# Patient Record
Sex: Male | Born: 1960
Health system: Southern US, Community
[De-identification: ages and names within clinical notes are randomized; demographics above are authoritative.]

## PROBLEM LIST (undated history)

## (undated) DIAGNOSIS — N529 Male erectile dysfunction, unspecified: Secondary | ICD-10-CM

## (undated) DIAGNOSIS — E785 Hyperlipidemia, unspecified: Secondary | ICD-10-CM

## (undated) HISTORY — DX: Male erectile dysfunction, unspecified: N52.9

## (undated) HISTORY — PX: TENDON REPAIR: SHX5111

## (undated) HISTORY — DX: Hyperlipidemia, unspecified: E78.5

---

## 2010-04-17 ENCOUNTER — Encounter: Payer: Self-pay | Admitting: Family Medicine

## 2010-04-17 ENCOUNTER — Other Ambulatory Visit: Payer: Self-pay | Admitting: Family Medicine

## 2010-04-17 ENCOUNTER — Encounter (INDEPENDENT_AMBULATORY_CARE_PROVIDER_SITE_OTHER): Payer: Self-pay | Admitting: *Deleted

## 2010-04-17 ENCOUNTER — Ambulatory Visit (INDEPENDENT_AMBULATORY_CARE_PROVIDER_SITE_OTHER): Payer: Managed Care, Other (non HMO) | Admitting: Family Medicine

## 2010-04-17 DIAGNOSIS — Z0000 Encounter for general adult medical examination without abnormal findings: Secondary | ICD-10-CM

## 2010-04-17 DIAGNOSIS — Z23 Encounter for immunization: Secondary | ICD-10-CM

## 2010-04-17 DIAGNOSIS — Z1211 Encounter for screening for malignant neoplasm of colon: Secondary | ICD-10-CM

## 2010-04-17 DIAGNOSIS — E785 Hyperlipidemia, unspecified: Secondary | ICD-10-CM

## 2010-04-17 DIAGNOSIS — Z13220 Encounter for screening for lipoid disorders: Secondary | ICD-10-CM

## 2010-04-17 LAB — BASIC METABOLIC PANEL
BUN: 10 mg/dL (ref 6–23)
CO2: 28 mEq/L (ref 19–32)
Calcium: 9.3 mg/dL (ref 8.4–10.5)
Chloride: 105 mEq/L (ref 96–112)
Creatinine, Ser: 0.8 mg/dL (ref 0.4–1.5)
Glucose, Bld: 91 mg/dL (ref 70–99)

## 2010-04-17 LAB — LIPID PANEL: Triglycerides: 171 mg/dL — ABNORMAL HIGH (ref 0.0–149.0)

## 2010-04-17 LAB — HEPATIC FUNCTION PANEL
ALT: 22 U/L (ref 0–53)
Albumin: 4.2 g/dL (ref 3.5–5.2)
Bilirubin, Direct: 0.1 mg/dL (ref 0.0–0.3)
Total Protein: 7.2 g/dL (ref 6.0–8.3)

## 2010-04-17 LAB — CONVERTED CEMR LAB: HDL goal, serum: 40 mg/dL

## 2010-04-17 LAB — LDL CHOLESTEROL, DIRECT: Direct LDL: 148 mg/dL

## 2010-04-25 ENCOUNTER — Encounter (INDEPENDENT_AMBULATORY_CARE_PROVIDER_SITE_OTHER): Payer: Self-pay | Admitting: *Deleted

## 2010-04-25 NOTE — Assessment & Plan Note (Signed)
Summary: NEW PT TO EST JRT   Vital Signs:  Patient profile:   50 year old male Height:      72 inches Weight:      179.75 pounds BMI:     24.47 Temp:     97.8 degrees F oral Pulse rate:   76 / minute Pulse rhythm:   regular BP sitting:   134 / 90  (left arm) Cuff size:   regular  Vitals Entered By: Delilah Shan CMA Cheo Selvey Dull) (April 17, 2010 9:52 AM) CC: New Patient to Establish CPE   History of Present Illness: NP CPE: Needs to est care.  Occ constipation and occ loose stools.  Sleep disrupted with childcare and work schedule.  No blood in stool.  No FCNAV.   No known FH prostate CA or colon CA.    Needs screening for DM/HLD.    Mild decrease in libido f/o florid ED.  Not much scheduled exercise.    Lipid Management History:      Positive NCEP/ATP III risk factors include male age 24 years old or older.     Allergies (verified): No Known Drug Allergies  Past History:  Past Medical History: unremarkable  Past Surgical History: R 3rd finger tendon repair  Family History: Reviewed history and no changes required. F alive, recently reest contact with patient but minimal hx M alive, h/o breast cancer- treated 1 brother, minimal contact No known FH prostate CA or colon CA.    Social History: Reviewed history and no changes required. 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  Otherwise negative.    Physical Exam  General:  GEN: nad, alert and oriented HEENT: mucous membranes moist NECK: supple w/o LA CV: rrr.  no murmur PULM: ctab, no inc wob ABD: soft, +bs EXT: no edema SKIN: no acute rash  Rectal:  No external abnormalities noted. Normal sphincter tone. No rectal masses or  tenderness. Prostate:  Prostate gland firm and smooth, no enlargement, nodularity, tenderness, mass, asymmetry or induration.   Impression & Recommendations:  Problem # 1:  Preventive Health Care (ICD-V70.0) PSA options were discussed along with recent recs.  No indication for psa at this point, since patient is low risk and there is no FH of prosate CA.  He declined testing of PSA.  Flu shot encouraged.  Tdap done today.  refer for colon CA screen.  d/w patient ZO:XWRU and exrecise.  see labs.    Problem # 2:  SPECIAL SCREENING FOR MALIGNANT NEOPLASMS COLON (ICD-V76.51) refer.  Orders: Gastroenterology Referral (GI)  Problem # 3:  SCREENING FOR LIPOID DISORDERS (ICD-V77.91) see labs.   Orders: TLB-BMP (Basic Metabolic Panel-BMET) (80048-METABOL) TLB-Hepatic/Liver Function Pnl (80076-HEPATIC) TLB-Lipid Panel (80061-LIPID)  Other Orders: Tdap => 22yrs IM (04540) Admin 1st Vaccine (98119)  Lipid Assessment/Plan:      Based on NCEP/ATP III, the patient's risk factor category is "0-1 risk factors".  The patient's lipid goals are as follows: Total cholesterol goal is 200; LDL cholesterol goal is 160; HDL cholesterol goal is 40; Triglyceride goal is 150.     Patient Instructions: 1)  See Shirlee Limerick about your referral before your leave today.   2)  I would  work on getting more exercise.   3)  You can get your results through our phone system.  Follow the instructions on the blue card.    Orders Added: 1)  New Patient 40-64 years [99386] 2)  TLB-BMP (Basic Metabolic Panel-BMET) [80048-METABOL] 3)  TLB-Hepatic/Liver Function Pnl [80076-HEPATIC] 4)  TLB-Lipid Panel [80061-LIPID] 5)  Tdap => 42yrs IM [90715] 6)  Admin 1st Vaccine [90471] 7)  Gastroenterology Referral [GI]   Immunizations Administered:  Tetanus Vaccine:    Vaccine Type: Tdap    Site: left deltoid    Mfr: GlaxoSmithKline    Dose: 0.5 ml    Route: IM    Given by: Delilah Shan CMA (AAMA)    Exp. Date: 12/16/2011     Lot #: [id number]    VIS given: 01/14/08 version given April 17, 2010.   Immunizations Administered:  Tetanus Vaccine:    Vaccine Type: Tdap    Site: left deltoid    Mfr: GlaxoSmithKline    Dose: 0.5 ml    Route: IM    Given by: Delilah Shan CMA (AAMA)    Exp. Date: 12/16/2011    Lot #: [id number]    VIS given: 01/14/08 version given April 17, 2010.  Prior Medications (reviewed today): None Current Allergies (reviewed today): No known allergies

## 2010-04-25 NOTE — Letter (Signed)
Summary: Pre Visit Letter Revised  [facility]  [address] [place], [place] [address]   Phone: [phone]  Fax: [phone]        04/17/2010 MRN: [id number] Cody Ryan [address] [place], [place]  [address]  [place]             Procedure Date:  May 22, 2010   dir col-Dr Tomasita Morrow to the Gastroenterology Division at [facility].    You are scheduled to see a nurse for your pre-procedure visit on May 01, 2010 at 4:00pm on the 3rd floor at [facility], [address]  We ask that you try to arrive at our office 15 minutes prior to your appointment time to allow for check-in.  Please take a minute to review the attached form.  If you answer "Yes" to one or more of the questions on the first page, we ask that you call the person listed at your earliest opportunity.  If you answer "No" to all of the questions, please complete the rest of the form and bring it to your appointment.    Your nurse visit will consist of discussing your medical and surgical history, your immediate family medical history, and your medications.   If you are unable to list all of your medications on the form, please bring the medication bottles to your appointment and we will list them.  We will need to be aware of both prescribed and over the counter drugs.  We will need to know exact dosage information as well.    Please be prepared to read and sign documents such as consent forms, a financial agreement, and acknowledgement forms.  If necessary, and with your consent, a friend or relative is welcome to sit-in on the nurse visit with you.  Please bring your insurance card so that we may make a copy of it.  If your insurance requires a referral to see a specialist, please bring your referral form from your primary care physician.  No co-pay is required for this nurse visit.     If you cannot keep your appointment, please call [phone] to cancel or reschedule prior  to your appointment date.  This allows [place] the opportunity to schedule an appointment for another patient in need of care.    Thank you for choosing [facility] for your medical needs.  We appreciate the opportunity to care for you.  Please visit [place] at our website  to learn more about our practice.  Sincerely, The Gastroenterology Division

## 2010-05-01 ENCOUNTER — Encounter: Payer: Self-pay | Admitting: Gastroenterology

## 2010-05-09 NOTE — Miscellaneous (Signed)
Summary: [facility] PV  Clinical Lists Changes  Medications: Added new medication of MOVIPREP 100 GM  SOLR (PEG-KCL-NACL-NASULF-NA ASC-C) As per prep instructions. - Signed Rx of MOVIPREP 100 GM  SOLR (PEG-KCL-NACL-NASULF-NA ASC-C) As per prep instructions.;  #1 x 0;  Signed;  Entered by: Ezra Sites RN;  Authorized by: Mardella Layman MD [facility];  Method used: [facility]  [place], [place], [place]  [address], Ph: [phone] or [phone], Fax: [phone] Observations: Added new observation of NKA: T (05/01/2010 16:13)    Prescriptions: MOVIPREP 100 GM  SOLR (PEG-KCL-NACL-NASULF-NA ASC-C) As per prep instructions.  #1 x 0   Entered by:   Ezra Sites RN   Authorized by:   Mardella Layman MD [facility]   Signed by:   Ezra Sites RN on 05/01/2010   Method used:   [facility]        [facility]  [place] (retail)       [address]       [place], [place]  [address]       Ph: [phone] or [phone]       Fax: [phone]   RxID:   [id number]

## 2010-05-09 NOTE — Letter (Signed)
Summary: [facility] Instructions  [facility]  [address] [place], [place] [address]   Phone: [phone]  Fax: [phone]       Cody Ryan    1960/12/13    MRN: [id number]        Procedure Day Dorna Bloom:  Duanne Limerick 05/22/10     Arrival Time: 9:30am     Procedure Time: 10:30am     Location of Procedure:                    _ X_  [facility] (4th Floor)                       PREPARATION FOR COLONOSCOPY WITH MOVIPREP   Starting 5 days prior to your procedure  [facility] 03/21  do not eat nuts, seeds, popcorn, corn, beans, peas,  salads, or any raw vegetables.  Do not take any fiber supplements (e.g. Metamucil, Citrucel, and Benefiber).  THE DAY BEFORE YOUR PROCEDURE         DATE: SUNDAY 03/25  1.  Drink clear liquids the entire day-NO SOLID FOOD  2.  Do not drink anything colored red or purple.  Avoid juices with pulp.  No orange juice.  3.  Drink at least 64 oz. (8 glasses) of fluid/clear liquids during the day to prevent dehydration and help the prep work efficiently.  CLEAR LIQUIDS INCLUDE: Water Jello Ice Popsicles Tea (sugar ok, no milk/cream) Powdered fruit flavored drinks Coffee (sugar ok, no milk/cream) Gatorade Juice: apple, white grape, white cranberry  Lemonade Clear bullion, consomm, broth Carbonated beverages (any kind) Strained chicken noodle soup Hard Candy                             4.  In the morning, mix first dose of MoviPrep solution:    Empty 1 Pouch A and 1 Pouch B into the disposable container    Add lukewarm drinking water to the top line of the container. Mix to dissolve    Refrigerate (mixed solution should be used within 24 hrs)  5.  Begin drinking the prep at 5:00 p.m. The MoviPrep container is divided by 4 marks.   Every 15 minutes drink the solution down to the next mark (approximately 8 oz) until the full liter is complete.   6.  Follow completed prep with 16 oz of clear liquid of your choice (Nothing  red or purple).  Continue to drink clear liquids until bedtime.  7.  Before going to bed, mix second dose of MoviPrep solution:    Empty 1 Pouch A and 1 Pouch B into the disposable container    Add lukewarm drinking water to the top line of the container. Mix to dissolve    Refrigerate  THE DAY OF YOUR PROCEDURE      DATE: MONDAY 03/26  Beginning at  5:30a.m. (5 hours before procedure):         1. Every 15 minutes, drink the solution down to the next mark (approx 8 oz) until the full liter is complete.  2. Follow completed prep with 16 oz. of clear liquid of your choice.    3. You may drink clear liquids until 8:30am  (2 HOURS BEFORE PROCEDURE).   MEDICATION INSTRUCTIONS  Unless otherwise instructed, you should take regular prescription medications with a small sip of water   as early as possible the morning of your procedure.  OTHER INSTRUCTIONS  You will need a responsible adult at least 51 years of age to accompany you and drive you home.   This person must remain in the waiting room during your procedure.  Wear loose fitting clothing that is easily removed.  Leave jewelry and other valuables at home.  However, you may wish to bring a book to read or  an iPod/MP3 player to listen to music as you wait for your procedure to start.  Remove all body piercing jewelry and leave at home.  Total time from sign-in until discharge is approximately 2-3 hours.  You should go home directly after your procedure and rest.  You can resume normal activities the  day after your procedure.  The day of your procedure you should not:   Drive   Make legal decisions   Operate machinery   Drink alcohol   Return to work  You will receive specific instructions about eating, activities and medications before you leave.    The above instructions have been reviewed and explained to me by   Ezra Sites RN  May 01, 2010 4:31 PM    I fully understand and can  verbalize these instructions _____________________________ Date _________

## 2010-05-19 ENCOUNTER — Encounter: Payer: Self-pay | Admitting: Gastroenterology

## 2010-05-22 ENCOUNTER — Ambulatory Visit: Payer: Managed Care, Other (non HMO) | Admitting: Gastroenterology

## 2010-05-22 ENCOUNTER — Encounter: Payer: Self-pay | Admitting: Gastroenterology

## 2010-05-22 VITALS — BP 133/79 | HR 76 | Temp 97.8°F | Resp 16 | Ht 72.0 in | Wt 170.0 lb

## 2010-05-22 DIAGNOSIS — Z1211 Encounter for screening for malignant neoplasm of colon: Secondary | ICD-10-CM

## 2010-05-22 DIAGNOSIS — D129 Benign neoplasm of anus and anal canal: Secondary | ICD-10-CM

## 2010-05-22 DIAGNOSIS — D128 Benign neoplasm of rectum: Secondary | ICD-10-CM

## 2010-05-22 DIAGNOSIS — R6889 Other general symptoms and signs: Secondary | ICD-10-CM

## 2010-05-22 LAB — HM COLONOSCOPY

## 2010-05-22 NOTE — Progress Notes (Signed)
500 ml ns added at 1102

## 2010-05-22 NOTE — Patient Instructions (Signed)
Normal Exam  Recommendations: Colonoscopy screening in 10 Years  Pt discharge instructions given and explained to pt and caregiver.

## 2010-05-23 ENCOUNTER — Telehealth: Payer: Self-pay | Admitting: *Deleted

## 2010-05-23 NOTE — Telephone Encounter (Signed)

## 2010-05-26 ENCOUNTER — Encounter: Payer: Self-pay | Admitting: Gastroenterology

## 2010-05-30 NOTE — Procedures (Signed)
Summary: Colonoscopy  Patient: Cody Ryan Note: All result statuses are Final unless otherwise noted.  Tests: (1) Colonoscopy (COL)   COL Colonoscopy           DONE     [facility]     [address]     [place], [place]  [address]          COLONOSCOPY PROCEDURE REPORT          PATIENT:  [name], [name]  MR#:  [id number]     BIRTHDATE:  1961/01/24, 49 yrs. old  GENDER:  male     ENDOSCOPIST:  Vania Rea. Jarold Motto, MD, [facility]     REF. BY:     PROCEDURE DATE:  05/22/2010     PROCEDURE:  Average-risk screening colonoscopy     G0121     ASA CLASS:  Class I     INDICATIONS:  Routine Risk Screening     MEDICATIONS:   Fentanyl 75 mcg IV, Versed 7 mg IV          DESCRIPTION OF PROCEDURE:   After the risks benefits and     alternatives of the procedure were thoroughly explained, informed     consent was obtained.  Digital rectal exam was performed and     revealed no abnormalities.   The [id number] endoscope was     introduced through the anus and advanced to the cecum, which was     identified by both the appendix and ileocecal valve, without     limitations.  The quality of the prep was excellent, using     MoviPrep.  The instrument was then slowly withdrawn as the colon     was fully examined.     <<PROCEDUREIMAGES>>          FINDINGS:  No polyps or cancers were seen.  This was otherwise a     normal examination of the colon.   Retroflexed views in the rectum     revealed no abnormalities.    The scope was then withdrawn from     the patient and the procedure completed.          COMPLICATIONS:  None     ENDOSCOPIC IMPRESSION:     1) No polyps or cancers     2) Otherwise normal examination     RECOMMENDATIONS:     1) Continue current colorectal screening recommendations for     "routine risk" patients with a repeat colonoscopy in 10 years.     REPEAT EXAM:  No          ______________________________     Vania Rea. Jarold Motto, MD, [facility]          CC:       n.     eSIGNED:   Vania Rea. Jessicah Croll at 05/22/2010 11:26 AM          Epifania Gore, [id number]  Note: An exclamation mark (!) indicates a result that was not dispersed into the flowsheet. Document Creation Date: 05/22/2010 11:27 AM _______________________________________________________________________  (1) Order result status: Final Collection or observation date-time: 05/22/2010 11:21 Requested date-time:  Receipt date-time:  Reported date-time:  Referring Physician:   Ordering Physician: Sheryn Bison [phone]) Specimen Source:  Source: Launa Grill Order Number: [id number] Lab site:

## 2011-04-16 ENCOUNTER — Encounter: Payer: Self-pay | Admitting: Family Medicine

## 2011-04-17 ENCOUNTER — Ambulatory Visit: Payer: Managed Care, Other (non HMO) | Admitting: Family Medicine

## 2011-04-26 ENCOUNTER — Ambulatory Visit (INDEPENDENT_AMBULATORY_CARE_PROVIDER_SITE_OTHER): Payer: Managed Care, Other (non HMO) | Admitting: Family Medicine

## 2011-04-26 ENCOUNTER — Encounter: Payer: Self-pay | Admitting: Family Medicine

## 2011-04-26 VITALS — BP 136/86 | HR 92 | Temp 97.4°F | Wt 171.0 lb

## 2011-04-26 DIAGNOSIS — N529 Male erectile dysfunction, unspecified: Secondary | ICD-10-CM

## 2011-04-26 MED ORDER — SILDENAFIL CITRATE 100 MG PO TABS: 50.0000 mg | ORAL_TABLET | Freq: Every day | ORAL | Status: DC | PRN

## 2011-04-26 NOTE — Patient Instructions (Signed)
Use the viagra, come back for labs, and call with concerns.  Glad to see you.

## 2011-04-26 NOTE — Progress Notes (Signed)
In last year, ED has gotten worse.  His erection doesn't last and he has trouble with arousal.  Libido is okay per patient.  Still with some AM erections, but he gets up very early so his sleep cycle is altered.  Urinary stream is okay.  Home situation is good, happy marriage.    Meds, vitals, and allergies reviewed.   ROS: See HPI.  Otherwise, noncontributory.  nad ncat Testes bilaterally descended without nodularity, tenderness or masses. No scrotal masses or lesions. No penis lesions or urethral discharge.

## 2011-04-27 DIAGNOSIS — N529 Male erectile dysfunction, unspecified: Secondary | ICD-10-CM | POA: Insufficient documentation

## 2011-04-27 NOTE — Assessment & Plan Note (Signed)
Can try viagra in meantime, with usual cautions.  Return for testosterone level.  If low, then will refer to uro.  He agrees.

## 2011-05-03 ENCOUNTER — Other Ambulatory Visit (INDEPENDENT_AMBULATORY_CARE_PROVIDER_SITE_OTHER): Payer: Managed Care, Other (non HMO)

## 2011-05-03 DIAGNOSIS — N529 Male erectile dysfunction, unspecified: Secondary | ICD-10-CM

## 2011-05-03 NOTE — Progress Notes (Signed)
Addended by: Liane Comber C on: 05/03/2011 10:59 AM   Modules accepted: Orders

## 2011-05-07 ENCOUNTER — Telehealth: Payer: Self-pay | Admitting: *Deleted

## 2011-05-07 NOTE — Telephone Encounter (Signed)
Received fax from [facility] stating that PA is needed for Viagra.  Called [employer] at [phone] to request PA form, form should be in our office before the end of today.

## 2011-05-07 NOTE — Telephone Encounter (Signed)
PA form in your IN box. 

## 2011-05-07 NOTE — Telephone Encounter (Signed)
PA form faxed to [facility] at [phone].

## 2011-05-07 NOTE — Telephone Encounter (Signed)
Done. It's in my out box.

## 2011-05-09 NOTE — Telephone Encounter (Signed)
That is usually the first option.  I don't think they would approve the other meds if that wasn't also approved.  It'll likely be out of pocket with any of the meds.

## 2011-05-09 NOTE — Telephone Encounter (Signed)
Patient wants to know if we have samples or coupons of Viagra or Cialis?  He stated that at this point he is willing to try anything to see if it helps.  Please advise.

## 2011-05-09 NOTE — Telephone Encounter (Signed)
Patient advised as instructed via telephone. 

## 2011-05-09 NOTE — Telephone Encounter (Signed)
If we have viagra samples or coupons, then give to patient.  I would start with that med first since it isn't long acting.  If we don't have samples or coupons, then it's out of pocket.

## 2011-05-09 NOTE — Telephone Encounter (Signed)
Patient advised via telephone that Viagra was not approved by his insurance company.  Patient is willing to try another medication and wants to know Dr. Lianne Bushy thoughts and opinions about this.  Denial letter sent to be scanned.

## 2011-05-11 NOTE — Telephone Encounter (Signed)
Patient advised as instructed via telephone, he will accept the Viagra coupons.  Will leave coupon at front desk for patient to pick up.

## 2011-09-12 ENCOUNTER — Other Ambulatory Visit: Payer: Self-pay | Admitting: Family Medicine

## 2011-09-12 DIAGNOSIS — E7800 Pure hypercholesterolemia, unspecified: Secondary | ICD-10-CM

## 2011-09-12 DIAGNOSIS — Z125 Encounter for screening for malignant neoplasm of prostate: Secondary | ICD-10-CM

## 2011-09-18 ENCOUNTER — Other Ambulatory Visit (INDEPENDENT_AMBULATORY_CARE_PROVIDER_SITE_OTHER): Payer: BC Managed Care – PPO

## 2011-09-18 DIAGNOSIS — Z125 Encounter for screening for malignant neoplasm of prostate: Secondary | ICD-10-CM

## 2011-09-18 DIAGNOSIS — E7800 Pure hypercholesterolemia, unspecified: Secondary | ICD-10-CM

## 2011-09-18 LAB — COMPREHENSIVE METABOLIC PANEL
Albumin: 4.4 g/dL (ref 3.5–5.2)
Alkaline Phosphatase: 54 U/L (ref 39–117)
Glucose, Bld: 93 mg/dL (ref 70–99)
Potassium: 4.4 mEq/L (ref 3.5–5.1)
Sodium: 137 mEq/L (ref 135–145)
Total Protein: 7.3 g/dL (ref 6.0–8.3)

## 2011-09-18 LAB — LIPID PANEL: HDL: 42.6 mg/dL (ref >39.00)

## 2011-09-18 LAB — PSA: PSA: 0.83 ng/mL (ref 0.10–4.00)

## 2011-09-25 ENCOUNTER — Encounter: Payer: Self-pay | Admitting: Family Medicine

## 2011-09-25 ENCOUNTER — Ambulatory Visit (INDEPENDENT_AMBULATORY_CARE_PROVIDER_SITE_OTHER): Payer: BC Managed Care – PPO | Admitting: Family Medicine

## 2011-09-25 VITALS — BP 126/84 | HR 78 | Temp 97.7°F | Wt 171.0 lb

## 2011-09-25 DIAGNOSIS — Z0000 Encounter for general adult medical examination without abnormal findings: Secondary | ICD-10-CM

## 2011-09-25 DIAGNOSIS — Z209 Contact with and (suspected) exposure to unspecified communicable disease: Secondary | ICD-10-CM

## 2011-09-25 DIAGNOSIS — K6289 Other specified diseases of anus and rectum: Secondary | ICD-10-CM

## 2011-09-25 DIAGNOSIS — N529 Male erectile dysfunction, unspecified: Secondary | ICD-10-CM

## 2011-09-25 DIAGNOSIS — M25519 Pain in unspecified shoulder: Secondary | ICD-10-CM

## 2011-09-25 DIAGNOSIS — B079 Viral wart, unspecified: Secondary | ICD-10-CM

## 2011-09-25 NOTE — Patient Instructions (Addendum)
I would get a flu shot each fall.   Go to the lab on the way out.  We'll contact you with your lab report. Use the exercises we discussed and take ibuprofen with food as needed.  If not improved, we may need to get you to orthopedics.  Take care.

## 2011-09-25 NOTE — Progress Notes (Signed)
CPE- See plan.  Routine anticipatory guidance given to patient.  See health maintenance. Tetanus 2012 Flu shot encouraged PSA wnl.  Consider recheck next year.  No FH prostate cancer.  Colon cancer screening.  No FH colon cancer known.  Due again in 2022.  Labs d/w pt.  Living will.  Discussed, encouraged.    ED with occ viagra use.  No ADE, good effect.    R shoulder pain, sharp, over the last week.  Pain with certain movements.  Best if laying flat on back. Sharp pain.  Intermittent pain/sx.  Still with good overhead ROM.    L hip pain and L lateral thigh, episodic over last few months.  Last episode was 2 months ago.  No pain today.    Small bump noted on penis.  Doesn't look like an ingrown hair.  Noted about 3-4 days ago.  Not itchy or painful.    Rectal itching.  Going on for long duration.  No acute flares.    PMH and SH reviewed  Meds, vitals, and allergies reviewed.   ROS: See HPI.  Otherwise negative.    GEN: nad, alert and oriented HEENT: mucous membranes moist NECK: supple w/o LA CV: rrr. PULM: ctab, no inc wob ABD: soft, +bs EXT: no edema SKIN: no acute rash Testes bilaterally descended without nodularity, tenderness or masses. No scrotal masses or lesions. No penis lesions except for 1-2 mm warty lesion on R side of shaft.  No urethral discharge.  R shoulder with pain on supraspinatus testing. Pain on int rotation, + impingement.  + scap assist.  No arm drop.

## 2011-09-26 DIAGNOSIS — M25519 Pain in unspecified shoulder: Secondary | ICD-10-CM | POA: Insufficient documentation

## 2011-09-26 DIAGNOSIS — Z0000 Encounter for general adult medical examination without abnormal findings: Secondary | ICD-10-CM | POA: Insufficient documentation

## 2011-09-26 DIAGNOSIS — K6289 Other specified diseases of anus and rectum: Secondary | ICD-10-CM | POA: Insufficient documentation

## 2011-09-26 DIAGNOSIS — B079 Viral wart, unspecified: Secondary | ICD-10-CM | POA: Insufficient documentation

## 2011-09-26 NOTE — Assessment & Plan Note (Signed)
Routine anticipatory guidance given to patient.  See health maintenance. Tetanus 2012 Flu shot encouraged PSA wnl.  Consider recheck next year.  No FH prostate cancer.  Colon cancer screening.  No FH colon cancer known.  Due again in 2022.  Labs d/w pt.  Living will.  Discussed, encouraged.

## 2011-09-26 NOTE — Assessment & Plan Note (Signed)
Likely cuff sx.  D/w pt about anatomy and home exercise program.  If not improved, then refer to ortho.  He agrees.

## 2011-09-26 NOTE — Assessment & Plan Note (Signed)
Treated x3 with liq N2 after verbal informed consent and tolerated well. See notes on labs.

## 2011-09-26 NOTE — Assessment & Plan Note (Signed)
Normal ext rectal exam, can use otc treatment as needed.

## 2011-09-26 NOTE — Assessment & Plan Note (Signed)
Controlled, continue current meds.   

## 2014-05-14 ENCOUNTER — Ambulatory Visit (INDEPENDENT_AMBULATORY_CARE_PROVIDER_SITE_OTHER): Payer: Managed Care, Other (non HMO) | Admitting: Primary Care

## 2014-05-14 ENCOUNTER — Encounter: Payer: Self-pay | Admitting: Primary Care

## 2014-05-14 VITALS — BP 136/72 | HR 98 | Temp 98.7°F | Ht 72.0 in | Wt 154.8 lb

## 2014-05-14 DIAGNOSIS — H6691 Otitis media, unspecified, right ear: Secondary | ICD-10-CM | POA: Diagnosis not present

## 2014-05-14 DIAGNOSIS — H6690 Otitis media, unspecified, unspecified ear: Secondary | ICD-10-CM | POA: Insufficient documentation

## 2014-05-14 MED ORDER — AMOXICILLIN 875 MG PO TABS: 875.0000 mg | ORAL_TABLET | Freq: Two times a day (BID) | ORAL | Status: DC

## 2014-05-14 NOTE — Progress Notes (Signed)
Subjective:    Patient ID: Cody Ryan, male    DOB: June 14, 1960, 54 y.o.   MRN: [id number]  HPI  Mr. Manuele is a 54 year old male who presents today with a chief complaint of ear pain. His pain is located to the right ear, had a sudden onset this morning with ringing and pressure. Three weeks ago he was acutely ill with chills, body aches, and sore throat. He began to feel better with the exception of a sore throat that he believes was caused by postnasal drip from his seasonal allergies. Last night he was unable to sleep due to pressure and pain in right ear. He's taken nothing OTC for his pain, and cannot pinpoint anything that makes pain better.   Review of Systems  Constitutional: Positive for chills and fatigue. Negative for fever.  HENT: Positive for ear pain, postnasal drip, sinus pressure, sore throat and trouble swallowing. Negative for rhinorrhea.   Eyes: Negative for itching.  Respiratory: Negative for cough and shortness of breath.   Cardiovascular: Negative for chest pain.  Gastrointestinal: Negative for nausea and vomiting.  Musculoskeletal: Positive for myalgias.  Skin: Negative for rash.  Neurological: Positive for headaches. Negative for dizziness.  Hematological: Negative for adenopathy.       Past Medical History  Diagnosis Date  . Erectile dysfunction     History   Social History  . Marital Status: Married    Spouse Name: N/A  . Number of Children: N/A  . Years of Education: N/A   Occupational History  . Not on file.   Social History Main Topics  . Smoking status: Passive Smoke Exposure - Never Smoker    Types: Cigarettes, Cigars  . Smokeless tobacco: Not on file  . Alcohol Use: 1.2 oz/week    2 Standard drinks or equivalent per week     Comment: 2 beers a week or less  . Drug Use: No  . Sexual Activity: Not on file   Other Topics Concern  . 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 Tendon repair      Right hand, 3rd finger    Family History  Problem Relation Age of Onset  . Cancer Mother     Breast, treated  . Colon cancer Neg Hx   . Prostate cancer Neg Hx     No Known Allergies  Current Outpatient Prescriptions on File Prior to Visit  Medication Sig Dispense Refill  . Multiple Vitamin (MULTIVITAMIN) tablet Take 1 tablet by mouth daily.     No current facility-administered medications on file prior to visit.    BP 136/72 mmHg  Pulse 98  Temp(Src) 98.7 F (37.1 C) (Oral)  Ht 6' (1.829 m)  Wt 154 lb 12.8 oz (70.217 kg)  BMI 20.99 kg/m2  SpO2 96%    Objective:   Physical Exam  Constitutional: He is oriented to person, place, and time. He appears well-developed.  HENT:  Right Ear: Tympanic membrane is injected, erythematous and bulging.  Left Ear: Tympanic membrane normal.  Nose: Nose normal.  Mouth/Throat: Posterior oropharyngeal edema and posterior oropharyngeal erythema present. No oropharyngeal exudate.  Eyes:  Conjunctivae are normal. Pupils are equal, round, and reactive to light.  Neck: Neck supple.  Cardiovascular: Normal rate and regular rhythm.   Pulmonary/Chest: Effort normal and breath sounds normal. He has no wheezes. He has no rales.  Lymphadenopathy:    He has no cervical adenopathy.  Neurological: He is alert and oriented to person, place, and time.  Skin: Skin is warm and dry.  Psychiatric: He has a normal mood and affect.          Assessment & Plan:

## 2014-05-14 NOTE — Patient Instructions (Signed)
Start Amoxicillin twice daily for 10 days. Use Ibuprofen for pain and inflammation. Please call me if no improvement in 3 days. I hope you feel better soon!  Otitis Media Otitis media is redness, soreness, and inflammation of the middle ear. Otitis media may be caused by allergies or, most commonly, by infection. Often it occurs as a complication of the common cold. SIGNS AND SYMPTOMS Symptoms of otitis media may include:  Earache.  Fever.  Ringing in your ear.  Headache.  Leakage of fluid from the ear. DIAGNOSIS To diagnose otitis media, your health care provider will examine your ear with an otoscope. This is an instrument that allows your health care provider to see into your ear in order to examine your eardrum. Your health care provider also will ask you questions about your symptoms. TREATMENT  Typically, otitis media resolves on its own within 3-5 days. Your health care provider may prescribe medicine to ease your symptoms of pain. If otitis media does not resolve within 5 days or is recurrent, your health care provider may prescribe antibiotic medicines if he or she suspects that a bacterial infection is the cause. HOME CARE INSTRUCTIONS   If you were prescribed an antibiotic medicine, finish it all even if you start to feel better.  Take medicines only as directed by your health care provider.  Keep all follow-up visits as directed by your health care provider. SEEK MEDICAL CARE IF:  You have otitis media only in one ear, or bleeding from your nose, or both.  You notice a lump on your neck.  You are not getting better in 3-5 days.  You feel worse instead of better. SEEK IMMEDIATE MEDICAL CARE IF:   You have pain that is not controlled with medicine.  You have swelling, redness, or pain around your ear or stiffness in your neck.  You notice that part of your face is paralyzed.  You notice that the bone behind your ear (mastoid) is tender when you touch it. MAKE  SURE YOU:   Understand these instructions.  Will watch your condition.  Will get help right away if you are not doing well or get worse. Document Released: 11/18/2003 Document Revised: 06/29/2013 Document Reviewed: 09/09/2012 [facility] Patient Information 2015 [place], [place]. This information is not intended to replace advice given to you by your health care provider. Make sure you discuss any questions you have with your health care provider.

## 2014-05-14 NOTE — Progress Notes (Signed)
Pre visit review using our clinic review tool, if applicable. No additional management support is needed unless otherwise documented below in the visit note. 

## 2014-05-14 NOTE — Assessment & Plan Note (Signed)
Acute, right. RX for Amoxicillin BID for 10 days. Ibuprofen for pain and inflammation. Follow up if no better in 3-4 days.

## 2014-08-08 ENCOUNTER — Encounter: Payer: Self-pay | Admitting: *Deleted

## 2014-08-08 ENCOUNTER — Emergency Department
Admission: EM | Admit: 2014-08-08 | Discharge: 2014-08-08 | Disposition: A | Discharge status: Home / Self Care | Payer: Managed Care, Other (non HMO) | Attending: Emergency Medicine | Admitting: Emergency Medicine

## 2014-08-08 DIAGNOSIS — Z79899 Other long term (current) drug therapy: Secondary | ICD-10-CM | POA: Diagnosis not present

## 2014-08-08 DIAGNOSIS — Y9389 Activity, other specified: Secondary | ICD-10-CM | POA: Insufficient documentation

## 2014-08-08 DIAGNOSIS — Y9289 Other specified places as the place of occurrence of the external cause: Secondary | ICD-10-CM | POA: Diagnosis not present

## 2014-08-08 DIAGNOSIS — Y998 Other external cause status: Secondary | ICD-10-CM | POA: Diagnosis not present

## 2014-08-08 DIAGNOSIS — S81812A Laceration without foreign body, left lower leg, initial encounter: Secondary | ICD-10-CM

## 2014-08-08 DIAGNOSIS — Z792 Long term (current) use of antibiotics: Secondary | ICD-10-CM | POA: Insufficient documentation

## 2014-08-08 MED ORDER — BACITRACIN-NEOMYCIN-POLYMYXIN OINTMENT TUBE
TOPICAL_OINTMENT | Freq: Once | CUTANEOUS | Status: AC
  Administered 2014-08-08: 1 via TOPICAL

## 2014-08-08 MED ORDER — TRAMADOL HCL 50 MG PO TABS
ORAL_TABLET | ORAL | Status: AC
  Administered 2014-08-08: 50 mg via ORAL
  Filled 2014-08-08: qty 1

## 2014-08-08 MED ORDER — OXYCODONE-ACETAMINOPHEN 7.5-325 MG PO TABS: 1.0000 | ORAL_TABLET | Freq: Four times a day (QID) | ORAL | Status: DC | PRN

## 2014-08-08 MED ORDER — BACITRACIN-NEOMYCIN-POLYMYXIN 400-5-5000 EX OINT
TOPICAL_OINTMENT | CUTANEOUS | Status: AC
  Administered 2014-08-08: 1 via TOPICAL
  Filled 2014-08-08: qty 1

## 2014-08-08 MED ORDER — LIDOCAINE-EPINEPHRINE (PF) 1 %-1:200000 IJ SOLN
INTRAMUSCULAR | Status: AC
  Filled 2014-08-08: qty 30

## 2014-08-08 MED ORDER — TRAMADOL HCL 50 MG PO TABS
50.0000 mg | ORAL_TABLET | Freq: Once | ORAL | Status: AC
  Administered 2014-08-08: 50 mg via ORAL

## 2014-08-08 NOTE — ED Notes (Signed)
Pt accidentally ran into a boat propeller, laceration sustained to lateral posterior L calf. Pt ambulatory in triage w/o complaints.

## 2014-08-08 NOTE — ED Provider Notes (Signed)
[facility] Emergency Department Provider Note  ____________________________________________  Time seen: Approximately 9:49 PM  I have reviewed the triage vital signs and the nursing notes.   HISTORY  Chief Complaint Extremity Laceration    HPI Cody Ryan is a 54 y.o. male laceration to the left lower leg secondary to cut by a boat propeller. Incident happened approximately 2 hours ago. Patient state hemorrhage and control with direct pressure. Denies any loss sensation or function. Patient tetanus shot is up-to-date. Patient is currently rating his pain as a 1/10.   Past Medical History  Diagnosis Date  . Erectile dysfunction     Patient Active Problem List   Diagnosis Date Noted  . Otitis media 05/14/2014  . Routine general medical examination at a health care facility 09/26/2011  . Rectal irritation 09/26/2011  . Shoulder pain 09/26/2011  . Wart 09/26/2011  . ED (erectile dysfunction) 04/27/2011    Past Surgical History  Procedure Laterality Date  . Tendon repair      Right hand, 3rd finger    Current Outpatient Rx  Name  Route  Sig  Dispense  Refill  . Multiple Vitamin (MULTIVITAMIN) tablet   Oral   Take 1 tablet by mouth daily.         Marland Kitchen amoxicillin (AMOXIL) 875 MG tablet   Oral   Take 1 tablet (875 mg total) by mouth 2 (two) times daily.   20 tablet   0   . oxyCODONE-acetaminophen (PERCOCET) 7.5-325 MG per tablet   Oral   Take 1 tablet by mouth every 6 (six) hours as needed for severe pain.   12 tablet   0     Allergies Review of patient's allergies indicates no known allergies.  Family History  Problem Relation Age of Onset  . Cancer Mother     Breast, treated  . Colon cancer Neg Hx   . Prostate cancer Neg Hx     Social History History  Substance Use Topics  . Smoking status: Passive Smoke Exposure - Never Smoker    Types: Cigarettes, Cigars  . Smokeless tobacco: Not on file  . Alcohol Use: 1.2 oz/week     2 Standard drinks or equivalent per week     Comment: 2 beers a week or less    Review of Systems Constitutional: No fever/chills Eyes: No visual changes. ENT: No sore throat. Cardiovascular: Denies chest pain. Respiratory: Denies shortness of breath. Gastrointestinal: No abdominal pain.  No nausea, no vomiting.  No diarrhea.  No constipation. Genitourinary: Negative for dysuria. Musculoskeletal: Negative for back pain. Skin: Negative for rash. 4 cm laceration lower left leg. Neurological: Negative for headaches, focal weakness or numbness.  10-point ROS otherwise negative.  ____________________________________________   PHYSICAL EXAM:  VITAL SIGNS: ED Triage Vitals  Enc Vitals Group     BP 08/08/14 2039 114/92 mmHg     Pulse Rate 08/08/14 2039 100     Resp 08/08/14 2039 18     Temp 08/08/14 2039 98.4 F (36.9 C)     Temp Source 08/08/14 2039 Oral     SpO2 08/08/14 2039 100 %     Weight 08/08/14 2039 155 lb (70.308 kg)     Height 08/08/14 2039 6' (1.829 m)     Head Cir --      Peak Flow --      Pain Score 08/08/14 2042 1     Pain Loc --      Pain Edu? --  Excl. in [place]? --    Constitutional: Alert and oriented. Well appearing and in no acute distress. Eyes: Conjunctivae are normal. PERRL. EOMI. Head: Atraumatic. Nose: No congestion/rhinnorhea. Mouth/Throat: Mucous membranes are moist.  Oropharynx non-erythematous. Neck: No stridor.  No deformity for nuchal range of motion nontender palpation. Hematological/Lymphatic/Immunilogical: No cervical lymphadenopathy. Cardiovascular: Normal rate, regular rhythm. Grossly normal heart sounds.  Good peripheral circulation. Respiratory: Normal respiratory effort.  No retractions. Lungs CTAB. Gastrointestinal: Soft and nontender. No distention. No abdominal bruits. No CVA tenderness. Musculoskeletal: No lower extremity tenderness nor edema.  No joint effusions. Neurologic:  Normal speech and language. No gross focal  neurologic deficits are appreciated. Speech is normal. No gait instability. Skin:  Skin is warm, dry and intact. No rash noted. Psychiatric: Mood and affect are normal. Speech and behavior are normal.  ____________________________________________   LABS (all labs ordered are listed, but only abnormal results are displayed)  Labs Reviewed - No data to display ____________________________________________  EKG   ____________________________________________  RADIOLOGY   ____________________________________________   PROCEDURES  Procedure(s) performed: See procedure note  Critical Care performed: No  ____________________________________________   INITIAL IMPRESSION / ASSESSMENT AND PLAN / ED COURSE  Pertinent labs & imaging results that were available during my care of the patient were reviewed by me and considered in my medical decision making (see chart for details).  LACERATION REPAIR Performed by: Sable Feil Authorized by: Sable Feil Consent: Verbal consent obtained. Risks and benefits: risks, benefits and alternatives were discussed Consent given by: patient Patient identity confirmed: provided demographic data Prepped and Draped in normal sterile fashion Wound explored  Laceration Location: Left lower leg  Laceration Length: 4 cm  No Foreign Bodies seen or palpated  Anesthesia: local infiltration  Local anesthetic: Lidocaine 1% with epinephrine   Anesthetic total: 7 mL Irrigation method: syringe Amount of cleaning: standard  Skin closure: 3-0 nylon Number of sutures: 10 interrupted  Patient tolerance: Patient tolerated the procedure well with no immediate complications.  ____________________________________________   FINAL CLINICAL IMPRESSION(S) / ED DIAGNOSES  Final diagnoses:  Leg laceration, left, initial encounter      Sable Feil, PA-C 08/08/14 2156  Delman Kitten, MD 08/09/14 [phone]

## 2014-08-08 NOTE — ED Notes (Signed)
Pt alert and in NAD at time of d/c 

## 2014-08-18 ENCOUNTER — Ambulatory Visit (INDEPENDENT_AMBULATORY_CARE_PROVIDER_SITE_OTHER): Payer: Managed Care, Other (non HMO) | Admitting: Family Medicine

## 2014-08-18 ENCOUNTER — Encounter: Payer: Self-pay | Admitting: Family Medicine

## 2014-08-18 VITALS — BP 100/60 | HR 84 | Temp 97.7°F | Wt 159.5 lb

## 2014-08-18 DIAGNOSIS — Z4802 Encounter for removal of sutures: Secondary | ICD-10-CM

## 2014-08-18 NOTE — Patient Instructions (Signed)
Take care.  Glad to see you.  The steri strips will gradually wear off.  Trim them as needed.

## 2014-08-18 NOTE — Progress Notes (Signed)
Pre visit review using our clinic review tool, if applicable. No additional management support is needed unless otherwise documented below in the visit note.  F/u for suture removal.  10 days out.  No complaints.  Td up to date.  No FCNAV.  No drainage.  No redness.  Normal ROM at the knee.   Meds, vitals, and allergies reviewed.   ROS: See HPI.  Otherwise, noncontributory.  L leg with normal inspection except for healing lac w/o erythema on the L knee  All 10 sutures removed easily.  No complication.  Still with good tissue apposition.  steristrips applied.

## 2014-08-19 DIAGNOSIS — Z4802 Encounter for removal of sutures: Secondary | ICD-10-CM | POA: Insufficient documentation

## 2014-08-19 NOTE — Assessment & Plan Note (Signed)
All 10 sutures removed easily.  No complication.  Still with good tissue apposition.  steristrips applied.   F/u prn.  He agrees.

## 2014-10-01 ENCOUNTER — Encounter: Payer: Self-pay | Admitting: Family Medicine

## 2014-10-01 ENCOUNTER — Ambulatory Visit (INDEPENDENT_AMBULATORY_CARE_PROVIDER_SITE_OTHER)
Admission: RE | Admit: 2014-10-01 | Discharge: 2014-10-01 | Disposition: A | Discharge status: Home / Self Care | Payer: Managed Care, Other (non HMO) | Source: Ambulatory Visit | Attending: Family Medicine | Admitting: Family Medicine

## 2014-10-01 ENCOUNTER — Ambulatory Visit (INDEPENDENT_AMBULATORY_CARE_PROVIDER_SITE_OTHER): Payer: Managed Care, Other (non HMO) | Admitting: Family Medicine

## 2014-10-01 VITALS — BP 120/70 | HR 71 | Temp 97.6°F | Wt 156.2 lb

## 2014-10-01 DIAGNOSIS — M25562 Pain in left knee: Secondary | ICD-10-CM

## 2014-10-01 DIAGNOSIS — M25569 Pain in unspecified knee: Secondary | ICD-10-CM | POA: Insufficient documentation

## 2014-10-01 NOTE — Assessment & Plan Note (Signed)
Likely ITB sx.  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  D/w pt.   SLR, rotated SLR, side leg lifts.  Check xray.  Ice and ibuprofen, GI caution.  F/u prn.  He agrees.

## 2014-10-01 NOTE — Progress Notes (Signed)
Pre visit review using our clinic review tool, if applicable. No additional management support is needed unless otherwise documented below in the visit note.  L knee pain.  No pain at rest usually but when active, getting up and down he'll have some pain.  He thinks he has some L hip pain from compensating.  H/o heavy lifting, bending, over the years.  Usually anterior pain at the kneecap and occ posterior pain, occ posterior pain.  "It is kind of hard to localize it."  A knee helped some prev (soft brace, no hard bar/bracing).  No bruising, no swelling.  He can't run anymore.  No locking or clicking.  He has the occ sensation of instability with direction changes.    Hasn't tried ibuprofen, ice, etc.  Sx got worse overall in the last few months.    Meds, vitals, and allergies reviewed.   ROS: See HPI.  Otherwise, noncontributory.  nad L knee with normal inspection.  Normal ROM, no weakness.   Joint line not ttp, patella not ttp L ITB tender Weak hip abductors noted, relative to quad strength.   ACL MCL LCL feel solid.   No crepitus, no locking.   Can bear weight.

## 2014-10-01 NOTE — Patient Instructions (Signed)
Likely ITB (iliotibial band syndrome). Go to the lab on the way out.  We'll contact you with your xray report. Try ice and ibuprofen with food for now.   The brace is still a good idea.   Straight leg raises, then externally rotated leg raises, then side leg lifts.  No weights on the leg lifts.   Start with 5--7-->10 up to 20 each.  Take care.  Update me as needed.

## 2016-10-05 ENCOUNTER — Encounter: Payer: Managed Care, Other (non HMO) | Admitting: Family Medicine

## 2016-10-08 ENCOUNTER — Encounter: Payer: Self-pay | Admitting: Family Medicine

## 2016-10-08 ENCOUNTER — Ambulatory Visit (INDEPENDENT_AMBULATORY_CARE_PROVIDER_SITE_OTHER): Payer: 59 | Admitting: Family Medicine

## 2016-10-08 VITALS — BP 132/72 | HR 80 | Temp 97.5°F | Ht 72.0 in | Wt 168.5 lb

## 2016-10-08 DIAGNOSIS — Z1159 Encounter for screening for other viral diseases: Secondary | ICD-10-CM

## 2016-10-08 DIAGNOSIS — E785 Hyperlipidemia, unspecified: Secondary | ICD-10-CM

## 2016-10-08 DIAGNOSIS — Z0000 Encounter for general adult medical examination without abnormal findings: Secondary | ICD-10-CM

## 2016-10-08 DIAGNOSIS — Z7189 Other specified counseling: Secondary | ICD-10-CM

## 2016-10-08 LAB — BASIC METABOLIC PANEL
BUN: 10 mg/dL (ref 6–23)
CO2: 24 mEq/L (ref 19–32)
Calcium: 9.4 mg/dL (ref 8.4–10.5)
Chloride: 105 mEq/L (ref 96–112)
Creatinine, Ser: 0.82 mg/dL (ref 0.40–1.50)
GFR: 102.95 mL/min (ref >60.00)
Glucose, Bld: 95 mg/dL (ref 70–99)
POTASSIUM: 3.9 meq/L (ref 3.5–5.1)
Sodium: 136 mEq/L (ref 135–145)

## 2016-10-08 LAB — LIPID PANEL
CHOLESTEROL: 182 mg/dL (ref 0–200)
HDL: 51.7 mg/dL (ref >39.00)
LDL CALC: 93 mg/dL (ref 0–99)
NonHDL: 130.55
TRIGLYCERIDES: 186 mg/dL — AB (ref 0.0–149.0)
Total CHOL/HDL Ratio: 4
VLDL: 37.2 mg/dL (ref 0.0–40.0)

## 2016-10-08 NOTE — Progress Notes (Signed)
CPE- See plan.  Routine anticipatory guidance given to patient.  See health maintenance.  The possibility exists that previously documented standard health maintenance information may have been brought forward from a previous encounter into this note.  If needed, that same information has been updated to reflect the current situation based on today's encounter.    Tetanus 2012 Flu shot encouraged PNA and shingles not yet due, d/w pt . Prostate cancer screening and PSA options (with potential risks and benefits of testing vs not testing) were discussed along with recent recs/guidelines.  He declined testing PSA at this point. No FH prostate cancer.  Colon cancer screening.  No FH colon cancer known.  Due again in 2022.  Living will.  Discussed, encouraged.  Wife designated if patient were incapacitated.   Taking vitamin D helped with muscle aches.   HIV prev tested.  Pt opts in for HCV screening.  D/w pt re: routine screening.   Diet and exercise d/w pt.  Encouraged both.   Labs pending, he had some sugar in his coffee.     He is working 3rd shift, driving, d/w pt.   Hx HLD noted, due for labs.  See notes on labs.   PMH and SH reviewed  Meds, vitals, and allergies reviewed.   ROS: Per HPI.  Unless specifically indicated otherwise in HPI, the patient denies:  General: fever. Eyes: acute vision changes ENT: sore throat Cardiovascular: chest pain Respiratory: SOB GI: vomiting GU: dysuria Musculoskeletal: acute back pain Derm: acute rash Neuro: acute motor dysfunction Psych: worsening mood Endocrine: polydipsia Heme: bleeding Allergy: hayfever  GEN: nad, alert and oriented HEENT: mucous membranes moist NECK: supple w/o LA CV: rrr. PULM: ctab, no inc wob ABD: soft, +bs EXT: no edema SKIN: no acute rash

## 2016-10-08 NOTE — Patient Instructions (Addendum)
Go to the lab on the way out.  We'll contact you with your lab report. Take care.  Glad to see you.  Update me as needed.  I would get a flu shot each fall.

## 2016-10-08 NOTE — Assessment & Plan Note (Signed)
Tetanus 2012 Flu shot encouraged PNA and shingles not yet due, d/w pt . Prostate cancer screening and PSA options (with potential risks and benefits of testing vs not testing) were discussed along with recent recs/guidelines.  He declined testing PSA at this point. No FH prostate cancer.  Colon cancer screening.  No FH colon cancer known.  Due again in 2022.  Living will.  Discussed, encouraged.  Wife designated if patient were incapacitated.   Taking vitamin D helped with muscle aches.   HIV prev tested.  Pt opts in for HCV screening.  D/w pt re: routine screening.   Diet and exercise d/w pt.  Encouraged both.   Labs pending, he had some sugar in his coffee.

## 2016-10-08 NOTE — Assessment & Plan Note (Signed)
Living will.  Discussed, encouraged.  Wife designated if patient were incapacitated.   

## 2016-10-09 LAB — HEPATITIS C ANTIBODY: HCV AB: NONREACTIVE

## 2017-02-01 DIAGNOSIS — Z23 Encounter for immunization: Secondary | ICD-10-CM | POA: Diagnosis not present

## 2017-10-11 ENCOUNTER — Ambulatory Visit (INDEPENDENT_AMBULATORY_CARE_PROVIDER_SITE_OTHER): Payer: 59 | Admitting: Family Medicine

## 2017-10-11 ENCOUNTER — Encounter: Payer: Self-pay | Admitting: Family Medicine

## 2017-10-11 VITALS — BP 128/82 | HR 83 | Temp 98.0°F | Ht 72.0 in | Wt 168.5 lb

## 2017-10-11 DIAGNOSIS — Z7189 Other specified counseling: Secondary | ICD-10-CM

## 2017-10-11 DIAGNOSIS — L989 Disorder of the skin and subcutaneous tissue, unspecified: Secondary | ICD-10-CM

## 2017-10-11 DIAGNOSIS — Z0000 Encounter for general adult medical examination without abnormal findings: Secondary | ICD-10-CM | POA: Diagnosis not present

## 2017-10-11 DIAGNOSIS — E785 Hyperlipidemia, unspecified: Secondary | ICD-10-CM | POA: Diagnosis not present

## 2017-10-11 LAB — BASIC METABOLIC PANEL
BUN: 10 mg/dL (ref 6–23)
CALCIUM: 10.1 mg/dL (ref 8.4–10.5)
CO2: 29 meq/L (ref 19–32)
CREATININE: 0.97 mg/dL (ref 0.40–1.50)
Chloride: 105 mEq/L (ref 96–112)
GFR: 84.5 mL/min (ref >60.00)
GLUCOSE: 104 mg/dL — AB (ref 70–99)
Potassium: 5.2 mEq/L — ABNORMAL HIGH (ref 3.5–5.1)
Sodium: 140 mEq/L (ref 135–145)

## 2017-10-11 LAB — LIPID PANEL
CHOL/HDL RATIO: 4
Cholesterol: 190 mg/dL (ref 0–200)
HDL: 52.6 mg/dL (ref >39.00)
LDL Cholesterol: 105 mg/dL — ABNORMAL HIGH (ref 0–99)
NONHDL: 137.55
Triglycerides: 162 mg/dL — ABNORMAL HIGH (ref 0.0–149.0)
VLDL: 32.4 mg/dL (ref 0.0–40.0)

## 2017-10-11 NOTE — Patient Instructions (Addendum)
Go to the lab on the way out.  We'll contact you with your lab report. Take care.  Glad to see you.  Thanks for your effort.   You have some varicose veins.  No treatment needed.  Try OTC ketoconazole/athlete's foot cream as needed.

## 2017-10-11 NOTE — Progress Notes (Signed)
CPE- See plan.  Routine anticipatory guidance given to patient.  See health maintenance.  The possibility exists that previously documented standard health maintenance information may have been brought forward from a previous encounter into this note.  If needed, that same information has been updated to reflect the current situation based on today's encounter.    Tetanus 2012 Flu shot usually done at pharmacy.  PNA and shingles not yet due, d/w pt.   Prostate cancer screening and PSA options (with potential risks and benefits of testing vs not testing) were discussed along with recent recs/guidelines.  He declined testing PSA at this point. No FH prostate cancer.  Colon cancer screening. No FH colon cancer known. Due again in 2022.  Living will. Discussed, encouraged.  Wife designated if patient were incapacitated.   Taking vitamin D helped with muscle aches and fatigue.   HIV prev tested.  HCV prev neg.   Diet and exercise d/w pt.  Encouraged both.   Labs pending, he had some sugar in his coffee.     No FCNAVD.  He has some occ stool frequency changes related to diet/schedule/working 3rd shift.  He had seen the dental clinic recently.    He has some irritation/discoloration on he shaft of his penis.  No itching, burning, no discharge.  Present for 6 months, present continuously.  Monogamous.  No testicle pain. No penile discharge.  He sits a lot at work, seems to have more irritation with humidity.    PMH and SH reviewed  Meds, vitals, and allergies reviewed.   ROS: Per HPI.  Unless specifically indicated otherwise in HPI, the patient denies:  General: fever. Eyes: acute vision changes ENT: sore throat Cardiovascular: chest pain Respiratory: SOB GI: vomiting GU: dysuria Musculoskeletal: acute back pain Derm: acute rash Neuro: acute motor dysfunction Psych: worsening mood Endocrine: polydipsia Heme: bleeding Allergy: hayfever  GEN: nad, alert and oriented HEENT: mucous  membranes moist NECK: supple w/o LA CV: rrr. PULM: ctab, no inc wob ABD: soft, +bs EXT: no edema SKIN: no acute rash but small varicose veins on the scrotum and likely fungal changes on the glans.

## 2017-10-13 DIAGNOSIS — L989 Disorder of the skin and subcutaneous tissue, unspecified: Secondary | ICD-10-CM | POA: Insufficient documentation

## 2017-10-13 NOTE — Assessment & Plan Note (Signed)
Tetanus 2012 Flu shot usually done at pharmacy.  PNA and shingles not yet due, d/w pt.   Prostate cancer screening and PSA options (with potential risks and benefits of testing vs not testing) were discussed along with recent recs/guidelines.  He declined testing PSA at this point. No FH prostate cancer.  Colon cancer screening. No FH colon cancer known. Due again in 2022.  Living will. Discussed, encouraged.  Wife designated if patient were incapacitated.   Taking vitamin D helped with muscle aches and fatigue.   HIV prev tested.  HCV prev neg.   Diet and exercise d/w pt.  Encouraged both.   Labs pending, he had some sugar in his coffee.

## 2017-10-13 NOTE — Assessment & Plan Note (Signed)
Has small varicose veins on the scrotum and likely fungal changes on the glans.  The former do not need treatment and the latter can be treated with an over-the-counter antifungal.  His symptoms are likely related to prolonged sitting and relative high humidity given his working conditions.  See avs.  He agrees.

## 2017-10-13 NOTE — Assessment & Plan Note (Signed)
Living will.  Discussed, encouraged.  Wife designated if patient were incapacitated.   

## 2017-12-09 ENCOUNTER — Telehealth: Payer: Self-pay

## 2017-12-09 DIAGNOSIS — J342 Deviated nasal septum: Secondary | ICD-10-CM

## 2017-12-09 NOTE — Telephone Encounter (Signed)
Copied from [place] [id number]. Topic: Referral - Request for Referral >> Dec 06, 2017  4:44 PM Yvette Rack wrote: Has patient seen PCP for this complaint? no *If NO, is insurance requiring patient see PCP for this issue before PCP can refer them? Yes Referral for which specialty: Pt states he has deviated septum Preferred provider/office: n/a Reason for referral: Pt requests referral to specialist  Dr Damita Dunnings please review. Does patient needs to see you for this or can we refer without OV. LOV was on 10/11/17 for CPE-Kareemah Grounds V Sumedha Munnerlyn, RMA

## 2017-12-10 NOTE — Addendum Note (Signed)
Addended by: Tonia Ghent on: 12/10/2017 06:22 AM   Modules accepted: Orders

## 2017-12-10 NOTE — Telephone Encounter (Signed)
Ordered. Thanks

## 2017-12-23 DIAGNOSIS — G4710 Hypersomnia, unspecified: Secondary | ICD-10-CM | POA: Diagnosis not present

## 2017-12-23 DIAGNOSIS — J301 Allergic rhinitis due to pollen: Secondary | ICD-10-CM | POA: Diagnosis not present

## 2017-12-23 DIAGNOSIS — J342 Deviated nasal septum: Secondary | ICD-10-CM | POA: Diagnosis not present

## 2018-01-20 DIAGNOSIS — G4733 Obstructive sleep apnea (adult) (pediatric): Secondary | ICD-10-CM | POA: Diagnosis not present

## 2018-02-24 ENCOUNTER — Other Ambulatory Visit: Payer: Self-pay

## 2018-02-24 NOTE — Telephone Encounter (Signed)
Pt was seen 10/11/17 for annual exam and pt said Dr Damita Dunnings offered a refill on sildenafil but at that time pt did not need refills but does now. Last refilled #10 x 11 on 04/26/2011. Pt request refill of sildenafil to [facility] [place]. Pt request cb when refilled.

## 2018-02-25 MED ORDER — SILDENAFIL CITRATE 100 MG PO TABS: 50.0000 mg | ORAL_TABLET | Freq: Every day | ORAL | 3 refills | Status: DC | PRN

## 2018-02-25 NOTE — Telephone Encounter (Signed)
plz notify this was refilled 

## 2018-02-27 ENCOUNTER — Encounter: Payer: Self-pay | Admitting: Family Medicine

## 2018-02-27 ENCOUNTER — Ambulatory Visit (INDEPENDENT_AMBULATORY_CARE_PROVIDER_SITE_OTHER): Payer: 59 | Admitting: Family Medicine

## 2018-02-27 DIAGNOSIS — M25521 Pain in right elbow: Secondary | ICD-10-CM

## 2018-02-27 NOTE — Patient Instructions (Signed)
You likely don't need any imaging.  Ice as needed.  Avoid prolonged pressure (arm rest, tabletop, etc). Take care.  Glad to see you.

## 2018-02-27 NOTE — Progress Notes (Signed)
R elbow.  He bumped it about 2-3 weeks ago, but not very hard, minimal pain at the time.  No visible swelling bruising or redness now.  It was never puffy.  Still with a pressure sensitive area on the olecranon.  Always had ROM wnl at the elbow.    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    Meds, vitals, and allergies reviewed.   ROS: Per HPI unless specifically indicated in ROS section   nad ncat Right shoulder with normal range of motion.  Normal range of motion at the right elbow and right wrist.  Normal grip.  Distally neurovascularly intact.  No pain on testing for golfers elbow or tennis elbow.  Forearm not tender.  He does not have puffiness at the olecranon but he does have some minimal tenderness there on palpation without bruising or redness.

## 2018-02-28 NOTE — Telephone Encounter (Signed)
Left message on vm per dpr notifying pt refill was sent in.  

## 2018-03-02 DIAGNOSIS — M25529 Pain in unspecified elbow: Secondary | ICD-10-CM | POA: Insufficient documentation

## 2018-03-02 NOTE — Assessment & Plan Note (Signed)
He likely irritated the right olecranon or the right olecranon bursa.  Exam is benign otherwise.  He would likely not benefit from imaging.  Discussed Ice as needed.  Avoid prolonged pressure (arm rest, tabletop, etc). I expect this to gradually improve but if it does not he can let me know.  Okay for outpatient follow-up.

## 2018-07-22 ENCOUNTER — Telehealth: Payer: Self-pay

## 2018-07-22 DIAGNOSIS — S61233A Puncture wound without foreign body of left middle finger without damage to nail, initial encounter: Secondary | ICD-10-CM | POA: Diagnosis not present

## 2018-07-22 NOTE — Telephone Encounter (Signed)
Pt left v/m requesting cb with date of last tetanus shot.

## 2018-07-22 NOTE — Telephone Encounter (Signed)
Wife says patient went to [facility] for a booster since he had not had one since 2012 and had sustained an injury to his hand.

## 2018-08-05 ENCOUNTER — Other Ambulatory Visit: Payer: Self-pay | Admitting: Family Medicine

## 2018-08-05 NOTE — Telephone Encounter (Signed)
Copied from [place] [id number]. Topic: Quick Communication - Rx Refill/Question >> Aug 05, 2018  4:56 PM Waylan Rocher, Lumin L wrote: Medication: sildenafil (VIAGRA) 20 MG tablet   Has the patient contacted their pharmacy? Yes (Agent: If no, request that the patient contact the pharmacy for the refill.) (Agent: If yes, when and what did the pharmacy advise?)  Preferred Pharmacy (with phone number or street name): [id number] [place], [place] [address] Phone: [phone] Fax: [phone]  Agent: Please be advised that RX refills may take up to 3 business days. We ask that you follow-up with your pharmacy.

## 2018-08-06 MED ORDER — SILDENAFIL CITRATE 100 MG PO TABS: 50.0000 mg | ORAL_TABLET | Freq: Every day | ORAL | 2 refills | Status: DC | PRN

## 2018-08-06 NOTE — Telephone Encounter (Signed)
Refill sent as requested. 

## 2018-09-12 ENCOUNTER — Encounter: Payer: Self-pay | Admitting: Family Medicine

## 2018-10-27 ENCOUNTER — Ambulatory Visit (INDEPENDENT_AMBULATORY_CARE_PROVIDER_SITE_OTHER): Payer: BC Managed Care – PPO | Admitting: Family Medicine

## 2018-10-27 ENCOUNTER — Other Ambulatory Visit: Payer: Self-pay

## 2018-10-27 ENCOUNTER — Encounter: Payer: Self-pay | Admitting: Family Medicine

## 2018-10-27 VITALS — BP 120/76 | HR 96 | Temp 98.4°F | Ht 71.0 in | Wt 167.1 lb

## 2018-10-27 DIAGNOSIS — E785 Hyperlipidemia, unspecified: Secondary | ICD-10-CM | POA: Diagnosis not present

## 2018-10-27 DIAGNOSIS — N529 Male erectile dysfunction, unspecified: Secondary | ICD-10-CM

## 2018-10-27 DIAGNOSIS — Z0000 Encounter for general adult medical examination without abnormal findings: Secondary | ICD-10-CM | POA: Diagnosis not present

## 2018-10-27 DIAGNOSIS — Z7189 Other specified counseling: Secondary | ICD-10-CM

## 2018-10-27 DIAGNOSIS — Z23 Encounter for immunization: Secondary | ICD-10-CM

## 2018-10-27 LAB — BASIC METABOLIC PANEL
BUN: 19 mg/dL (ref 6–23)
CO2: 24 mEq/L (ref 19–32)
Calcium: 9.4 mg/dL (ref 8.4–10.5)
Chloride: 105 mEq/L (ref 96–112)
Creatinine, Ser: 0.96 mg/dL (ref 0.40–1.50)
GFR: 80.17 mL/min (ref >60.00)
Glucose, Bld: 84 mg/dL (ref 70–99)
Potassium: 4.5 mEq/L (ref 3.5–5.1)
Sodium: 137 mEq/L (ref 135–145)

## 2018-10-27 LAB — LIPID PANEL
Cholesterol: 242 mg/dL — ABNORMAL HIGH (ref 0–200)
HDL: 49.1 mg/dL (ref >39.00)
NonHDL: 192.67
Total CHOL/HDL Ratio: 5
Triglycerides: 234 mg/dL — ABNORMAL HIGH (ref 0.0–149.0)
VLDL: 46.8 mg/dL — ABNORMAL HIGH (ref 0.0–40.0)

## 2018-10-27 LAB — LDL CHOLESTEROL, DIRECT: Direct LDL: 147 mg/dL

## 2018-10-27 MED ORDER — SILDENAFIL CITRATE 100 MG PO TABS: 50.0000 mg | ORAL_TABLET | Freq: Every day | ORAL | 12 refills | Status: DC | PRN

## 2018-10-27 NOTE — Assessment & Plan Note (Signed)
Living will.  Discussed, encouraged.  Wife designated if patient were incapacitated.   

## 2018-10-27 NOTE — Assessment & Plan Note (Signed)
Continue prn viagra, no ADE on med.

## 2018-10-27 NOTE — Assessment & Plan Note (Signed)
Tetanus 2020 Flu shot 2020 PNA and shingles not yet due, d/w pt.   Prostate cancer screening and PSA options(with potential risks and benefits of testing vs not testing) were discussed along with recent recs/guidelines. He declined testing PSAat this point. No FH prostate cancer.  Colon cancer screening. No FH colon cancer known. Due again in 2022.  Living will. Discussed, encouraged. Wife designated if patient were incapacitated.  Taking vitamin D helped with muscle aches and fatigue.   HIV prev tested.  HCV prev neg.   Diet and exercise d/w pt. Labs pending.   He has some occ hand cramping.  We talked about cutting back on coffee and staying well hydrated.  He doesn't have PIP DIP triggering.   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  D/w pt.  He is looking for work, d/w pt.

## 2018-10-27 NOTE — Patient Instructions (Signed)
Go to the lab on the way out.  We'll contact you with your lab report. Take care.  Glad to see you.  Thanks for getting a flu shot.  Update me as needed.

## 2018-10-27 NOTE — Progress Notes (Signed)
CPE- See plan.  Routine anticipatory guidance given to patient.  See health maintenance.  The possibility exists that previously documented standard health maintenance information may have been brought forward from a previous encounter into this note.  If needed, that same information has been updated to reflect the current situation based on today's encounter.    Tetanus 2020 Flu shot 2020 PNA and shingles not yet due, d/w pt.   Prostate cancer screening and PSA options(with potential risks and benefits of testing vs not testing) were discussed along with recent recs/guidelines. He declined testing PSAat this point. No FH prostate cancer.  Colon cancer screening. No FH colon cancer known. Due again in 2022.  Living will. Discussed, encouraged. Wife designated if patient were incapacitated.  Taking vitamin D helped with muscle aches and fatigue.   HIV prev tested.  HCV prev neg.   Diet and exercise d/w pt. Labs pending.   He has some occ hand cramping.  We talked about cutting back on coffee and staying well hydrated.  He doesn't have PIP DIP triggering.   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  D/w pt.  He is looking for work, d/w pt.    viagra worked, used prn.  No ADE on med.   PMH and SH reviewed Meds, vitals, and allergies reviewed.   ROS: Per HPI.  Unless specifically indicated otherwise in HPI, the patient denies:  General: fever. Eyes: acute vision changes ENT: sore throat Cardiovascular: chest pain Respiratory: SOB GI: vomiting GU: dysuria Musculoskeletal: acute back pain Derm: acute rash Neuro: acute motor dysfunction Psych: worsening mood Endocrine: polydipsia Heme: bleeding Allergy: hayfever  GEN: nad, alert and oriented HEENT: ncat NECK: supple w/o LA CV: rrr. PULM: ctab, no inc wob ABD: soft, +bs EXT: no edema SKIN: no acute rash Normal radial pulses and cap refill in the hands.

## 2019-06-01 ENCOUNTER — Other Ambulatory Visit: Payer: Self-pay

## 2019-06-01 ENCOUNTER — Ambulatory Visit: Payer: BC Managed Care – PPO | Attending: Internal Medicine

## 2019-06-01 DIAGNOSIS — Z23 Encounter for immunization: Secondary | ICD-10-CM

## 2019-06-01 NOTE — Progress Notes (Signed)
   Covid-19 Vaccination Clinic  Name:  Cody Ryan    MRN: [id number] DOB: 02-08-1961  06/01/2019  Mr. Cody Ryan was observed post Covid-19 immunization for 15 minutes without incident. He was provided with Vaccine Information Sheet and instruction to access the V-Safe system.   Mr. Cody Ryan was instructed to call 911 with any severe reactions post vaccine: Marland Kitchen Difficulty breathing  . Swelling of face and throat  . A fast heartbeat  . A bad rash all over body  . Dizziness and weakness   Immunizations Administered    Name Date Dose VIS Date Route   Pfizer COVID-19 Vaccine 06/01/2019  8:24 AM 0.3 mL 02/06/2019 Intramuscular   Manufacturer: [place]   Lot: [id number]   [place]: [id number]

## 2019-06-15 ENCOUNTER — Other Ambulatory Visit: Payer: Self-pay

## 2019-06-15 ENCOUNTER — Encounter: Payer: Self-pay | Admitting: Family Medicine

## 2019-06-15 ENCOUNTER — Ambulatory Visit (INDEPENDENT_AMBULATORY_CARE_PROVIDER_SITE_OTHER): Payer: BC Managed Care – PPO | Admitting: Family Medicine

## 2019-06-15 VITALS — BP 130/82 | HR 81 | Temp 98.3°F | Ht 71.0 in | Wt 171.5 lb

## 2019-06-15 DIAGNOSIS — M5416 Radiculopathy, lumbar region: Secondary | ICD-10-CM

## 2019-06-15 MED ORDER — CYCLOBENZAPRINE HCL 10 MG PO TABS: 5.0000 mg | ORAL_TABLET | Freq: Every evening | ORAL | 0 refills | Status: DC | PRN

## 2019-06-15 MED ORDER — PREDNISONE 20 MG PO TABS: ORAL_TABLET | ORAL | 0 refills | Status: DC

## 2019-06-15 NOTE — Progress Notes (Signed)
Cody Stakes T. Traven Davids, MD, [place] [facility] [place], [address]  Phone: [phone]  FAX: [phone]  Cody Ryan - 59 y.o. male  MRN [id number]  Date of Birth: 09-23-1960  Date: 06/15/2019  PCP: Tonia Ghent, MD  Referral: Tonia Ghent, MD  Chief Complaint  Patient presents with  . Back Pain    ?Pinched nerve/Sciatica    This visit occurred during the SARS-CoV-2 public health emergency.  Safety protocols were in place, including screening questions prior to the visit, additional usage of staff PPE, and extensive cleaning of exam room while observing appropriate contact time as indicated for disinfecting solutions.   Subjective:   Cody Ryan is a 59 y.o. very pleasant male patient with Body mass index is 23.92 kg/m. who presents with the following:  Pain in the left going down his left posterior.  Pain with walking.  Will go down - to the lower Ext  Starting last week and worsening on Friday, patient started developed some pain in his left lower back and down his posterior of his leg all the way down to the lower extremity.  He denies having any numbness, tingling, weakness.  No saddle anesthesia.  At times he feels like his leg may not completely support his weight, but he has not fallen.  He also feels like he is having some some sharp pains in his muscles will get quite fatigued when he is moving and walking.  Over the weekend, it did seem as if it is gotten better.  It least somewhat.  L foot dorsum - tingle with pinpriclk  Review of Systems is noted in the HPI, as appropriate   Objective:   BP 130/82   Pulse 81   Temp 98.3 F (36.8 C) (Temporal)   Ht 5\' 11"  (1.803 m)   Wt 171 lb 8 oz (77.8 kg)   SpO2 98%   BMI 23.92 kg/m    GEN: No acute distress; alert,appropriate. PULM: Breathing comfortably in no respiratory distress PSYCH: Normally  interactive.   Range of motion at  the waist: Flexion, extension, lateral bending and rotation: Appropriate and normal range of motion without loss.  No echymosis or edema Rises to examination table with mild difficulty Gait: minimally antalgic  Inspection/Deformity: N Paraspinus Tenderness: L3-S1, left greater than right  B Ankle Dorsiflexion (L5,4): 5/5 B Great Toe Dorsiflexion (L5,4): 5/5 Heel Walk (L5): WNL Toe Walk (S1): WNL Rise/Squat (L4): WNL, mild pain  SENSORY B Medial Foot (L4): WNL B Dorsum (L5): Diminished on pinprick and light touch B Lateral (S1): WNL  REFLEXES Knee (L4): 2+ Ankle (S1): 2+  B SLR, seated: neg B SLR, supine: neg B FABER: neg B Reverse FABER: neg B Greater Troch: NT B Log Roll: neg B Stork: NT B Sciatic Notch: NT   Radiology: No results found.  Assessment and Plan:     ICD-10-CM   1. Lumbar radiculopathy, acute  M54.16    Lumbar radiculopathy on the left with some mild sensory changes on the dorsum of his left foot.  Begin with range of motion and rehab, steroids x10 days.  Flexeril at night.  Do suspect he is going to do fine.  Follow-up: No follow-ups on file.  Meds ordered this encounter  Medications  . predniSONE (DELTASONE) 20 MG tablet    Sig: 2 tabs po daily for 5 days, then 1 tab po daily for  5 days    Dispense:  15 tablet    Refill:  0  . cyclobenzaprine (FLEXERIL) 10 MG tablet    Sig: Take 0.5-1 tablets (5-10 mg total) by mouth at bedtime as needed for muscle spasms.    Dispense:  30 tablet    Refill:  0   There are no discontinued medications. No orders of the defined types were placed in this encounter.   Signed,  Maud Deed. Nelwyn Hebdon, MD   Outpatient Encounter Medications as of 06/15/2019  Medication Sig  . cholecalciferol (VITAMIN D) 1000 units tablet Take 1,000 Units by mouth daily.  . Multiple Vitamin (MULTIVITAMIN) tablet Take 1 tablet by mouth daily.  . sildenafil (VIAGRA) 100 MG tablet Take 0.5-1  tablets (50-100 mg total) by mouth daily as needed for erectile dysfunction.  . cyclobenzaprine (FLEXERIL) 10 MG tablet Take 0.5-1 tablets (5-10 mg total) by mouth at bedtime as needed for muscle spasms.  . predniSONE (DELTASONE) 20 MG tablet 2 tabs po daily for 5 days, then 1 tab po daily for 5 days   No facility-administered encounter medications on file as of 06/15/2019.

## 2019-06-23 ENCOUNTER — Ambulatory Visit: Payer: BC Managed Care – PPO

## 2019-06-24 ENCOUNTER — Other Ambulatory Visit: Payer: Self-pay

## 2019-06-24 ENCOUNTER — Ambulatory Visit: Payer: BC Managed Care – PPO | Attending: Internal Medicine

## 2019-06-24 DIAGNOSIS — Z23 Encounter for immunization: Secondary | ICD-10-CM

## 2019-06-24 NOTE — Progress Notes (Signed)
   Covid-19 Vaccination Clinic  Name:  Jaired Kennemore    MRN: [id number] DOB: 15-May-1960  06/24/2019  Mr. Abadi was observed post Covid-19 immunization for 15 minutes without incident. He was provided with Vaccine Information Sheet and instruction to access the V-Safe system.   Mr. Middlemiss was instructed to call 911 with any severe reactions post vaccine: Marland Kitchen Difficulty breathing  . Swelling of face and throat  . A fast heartbeat  . A bad rash all over body  . Dizziness and weakness   Immunizations Administered    Name Date Dose VIS Date Route   Pfizer COVID-19 Vaccine 06/24/2019  2:55 PM 0.3 mL 04/22/2018 Intramuscular   Manufacturer: [place]   Lot: [id number]   [place]: [id number]

## 2019-09-26 DIAGNOSIS — S71111A Laceration without foreign body, right thigh, initial encounter: Secondary | ICD-10-CM | POA: Diagnosis not present

## 2019-10-22 ENCOUNTER — Ambulatory Visit: Payer: BC Managed Care – PPO | Admitting: Family Medicine

## 2019-10-29 ENCOUNTER — Encounter: Payer: Self-pay | Admitting: Family Medicine

## 2019-10-29 ENCOUNTER — Ambulatory Visit (INDEPENDENT_AMBULATORY_CARE_PROVIDER_SITE_OTHER): Payer: BC Managed Care – PPO | Admitting: Family Medicine

## 2019-10-29 ENCOUNTER — Other Ambulatory Visit: Payer: Self-pay

## 2019-10-29 VITALS — BP 132/80 | HR 98 | Temp 97.8°F | Ht 71.0 in | Wt 167.6 lb

## 2019-10-29 DIAGNOSIS — N529 Male erectile dysfunction, unspecified: Secondary | ICD-10-CM

## 2019-10-29 DIAGNOSIS — E785 Hyperlipidemia, unspecified: Secondary | ICD-10-CM

## 2019-10-29 DIAGNOSIS — Z0000 Encounter for general adult medical examination without abnormal findings: Secondary | ICD-10-CM | POA: Diagnosis not present

## 2019-10-29 DIAGNOSIS — Z125 Encounter for screening for malignant neoplasm of prostate: Secondary | ICD-10-CM

## 2019-10-29 DIAGNOSIS — Z7189 Other specified counseling: Secondary | ICD-10-CM

## 2019-10-29 LAB — BASIC METABOLIC PANEL
BUN: 9 mg/dL (ref 6–23)
CO2: 28 mEq/L (ref 19–32)
Calcium: 9.5 mg/dL (ref 8.4–10.5)
Chloride: 105 mEq/L (ref 96–112)
Creatinine, Ser: 0.88 mg/dL (ref 0.40–1.50)
GFR: 88.34 mL/min (ref >60.00)
Glucose, Bld: 91 mg/dL (ref 70–99)
Potassium: 4.8 mEq/L (ref 3.5–5.1)
Sodium: 139 mEq/L (ref 135–145)

## 2019-10-29 LAB — LIPID PANEL
Cholesterol: 232 mg/dL — ABNORMAL HIGH (ref 0–200)
HDL: 50.4 mg/dL (ref >39.00)
LDL Cholesterol: 152 mg/dL — ABNORMAL HIGH (ref 0–99)
NonHDL: 182.02
Total CHOL/HDL Ratio: 5
Triglycerides: 149 mg/dL (ref 0.0–149.0)
VLDL: 29.8 mg/dL (ref 0.0–40.0)

## 2019-10-29 LAB — PSA: PSA: 2.17 ng/mL (ref 0.10–4.00)

## 2019-10-29 MED ORDER — SILDENAFIL CITRATE 100 MG PO TABS: 50.0000 mg | ORAL_TABLET | Freq: Every day | ORAL | 12 refills | Status: DC | PRN

## 2019-10-29 NOTE — Progress Notes (Signed)
This visit occurred during the SARS-CoV-2 public health emergency.  Safety protocols were in place, including screening questions prior to the visit, additional usage of staff PPE, and extensive cleaning of exam room while observing appropriate contact time as indicated for disinfecting solutions.  CPE- See plan.  Routine anticipatory guidance given to patient.  See health maintenance.  The possibility exists that previously documented standard health maintenance information may have been brought forward from a previous encounter into this note.  If needed, that same information has been updated to reflect the current situation based on today's encounter.    Tetanus 2020 Flu shot 2020 covid vaccine 2021 PNA and shingles not yet due, d/w pt. see avs.   Prostate cancer screening and PSA options (with potential risks and benefits of testing vs not testing) were discussed along with recent recs/guidelines.  He opted testing PSA at this point. Colon cancer screening. No FH colon cancer known. Due again in 2022.  Living will. Discussed, encouraged. Wife designated if patient were incapacitated.  HIV prev tested.  HCV prev neg.  Diet and exercise d/w pt.  He is still working on both.   Labs pending.   ED.  Used sildenafil with effect w/o ADE.    He had some B knee and quad discomfort/soreness but it resolved after a day or so.  No sx now.  No trauma.  No rash.  He may have been relatively dehydrated, taking less water at the time.  He will update me as needed.  PMH and SH reviewed  Meds, vitals, and allergies reviewed.   ROS: Per HPI.  Unless specifically indicated otherwise in HPI, the patient denies:  General: fever. Eyes: acute vision changes ENT: sore throat Cardiovascular: chest pain Respiratory: SOB GI: vomiting GU: dysuria Musculoskeletal: acute back pain Derm: acute rash Neuro: acute motor dysfunction Psych: worsening mood Endocrine: polydipsia Heme: bleeding Allergy:  hayfever  GEN: nad, alert and oriented HEENT: ncat NECK: supple w/o LA CV: rrr. PULM: ctab, no inc wob ABD: soft, +bs EXT: no edema SKIN: no acute rash Quads not tender to palpation bilaterally.  Normal knee range of motion bilaterally without tenderness.

## 2019-10-29 NOTE — Patient Instructions (Addendum)
Check with your insurance to see if they will cover the shingrix shot. I would get a flu shot each fall.   Go to the lab on the way out.   If you have mychart we'll likely use that to update you.    Take care.  Glad to see you. Thanks for your effort.

## 2019-11-02 NOTE — Assessment & Plan Note (Signed)
Tetanus 2020 Flu shot 2020 covid vaccine 2021 PNA and shingles not yet due, d/w pt. see avs.   Prostate cancer screening and PSA options (with potential risks and benefits of testing vs not testing) were discussed along with recent recs/guidelines.  He opted testing PSA at this point. Colon cancer screening. No FH colon cancer known. Due again in 2022.  Living will. Discussed, encouraged. Wife designated if patient were incapacitated.  HIV prev tested.  HCV prev neg.  Diet and exercise d/w pt.  He is still working on both.   Labs pending.

## 2019-11-02 NOTE — Assessment & Plan Note (Signed)
Living will.  Discussed, encouraged.  Wife designated if patient were incapacitated.   

## 2019-11-02 NOTE — Assessment & Plan Note (Signed)
Used sildenafil with effect w/o ADE.   Continue as is.  He agrees.

## 2020-03-11 ENCOUNTER — Encounter: Payer: Self-pay | Admitting: Family Medicine

## 2020-03-11 ENCOUNTER — Ambulatory Visit (INDEPENDENT_AMBULATORY_CARE_PROVIDER_SITE_OTHER): Payer: BC Managed Care – PPO | Admitting: Family Medicine

## 2020-03-11 ENCOUNTER — Other Ambulatory Visit: Payer: Self-pay

## 2020-03-11 DIAGNOSIS — M5430 Sciatica, unspecified side: Secondary | ICD-10-CM | POA: Diagnosis not present

## 2020-03-11 MED ORDER — PREDNISONE 20 MG PO TABS: ORAL_TABLET | ORAL | 0 refills | Status: DC

## 2020-03-11 MED ORDER — HYDROCODONE-ACETAMINOPHEN 5-325 MG PO TABS
1.0000 | ORAL_TABLET | Freq: Four times a day (QID) | ORAL | 0 refills | Status: DC | PRN
Start: 2020-03-11 — End: 2020-03-25

## 2020-03-11 NOTE — Patient Instructions (Signed)
Take 2 prednisone a day for 5 days, then 1 a day for 5 days, with food. Don't take with aleve/ibuprofen. Hydrocodone if needed.  It already has tylenol in the pills.   Sedation caution.  Gently stretch, use the handout.  Update me as needed.  Take care.  Glad to see you.

## 2020-03-11 NOTE — Progress Notes (Signed)
This visit occurred during the SARS-CoV-2 public health emergency.  Safety protocols were in place, including screening questions prior to the visit, additional usage of staff PPE, and extensive cleaning of exam room while observing appropriate contact time as indicated for disinfecting solutions.  L leg pain.  Started about 2 weeks ago, worse last week.  No R sided pain.  No FCNAVD.  Sharp pain, down the L leg, posterior side of the leg.  Taking ibuprofen and tylenol.  No abd pain.  Some numbness on the L lower back and buttock.  He tried stretching w/o relief.  No arm sx.    Meds, vitals, and allergies reviewed.   ROS: Per HPI unless specifically indicated in ROS section   nad ncat Neck supple, no LA rrr ctab abd soft, not ttp Extremities well perfused. Lower midline back tender to palpation in bilateral straight leg raise positive for left leg pain. No saddle anesthesia. Distally neurovascular intact. Able to bear weight.

## 2020-03-14 DIAGNOSIS — M5430 Sciatica, unspecified side: Secondary | ICD-10-CM | POA: Insufficient documentation

## 2020-03-14 NOTE — Assessment & Plan Note (Signed)
Typical symptoms for sciatica. Anatomy discussed with patient. Management discussed with patient. No need for imaging at this point. Okay for outpatient follow-up.  Take 2 prednisone a day for 5 days, then 1 a day for 5 days, with food. Don't take with aleve/ibuprofen. Hydrocodone if needed.  It already has tylenol in the pills.   Sedation caution. Steroid and opiate cautions discussed with patient. Gently stretch, use the home exercise/stretching handout, discussed with patient.Marland Kitchen  Update me as needed. He agrees to plan.

## 2020-03-18 ENCOUNTER — Encounter: Payer: Self-pay | Admitting: Family Medicine

## 2020-03-18 ENCOUNTER — Other Ambulatory Visit: Payer: Self-pay

## 2020-03-18 ENCOUNTER — Telehealth: Payer: BC Managed Care – PPO | Admitting: Family Medicine

## 2020-03-18 VITALS — Ht 71.0 in | Wt 170.0 lb

## 2020-03-18 DIAGNOSIS — M5430 Sciatica, unspecified side: Secondary | ICD-10-CM

## 2020-03-18 MED ORDER — GABAPENTIN 100 MG PO CAPS
100.0000 mg | ORAL_CAPSULE | Freq: Three times a day (TID) | ORAL | 1 refills | Status: DC | PRN
Start: 2020-03-18 — End: 2020-04-05

## 2020-03-18 NOTE — Progress Notes (Signed)
Patient has been having sciatic issues that are not improving. Currently taking prednisone tabs and hydrocodone tabs as needed for the pain. Patient is taking the hydrocodone 2-3 times a day; takes 1/2 tab at a time.

## 2020-03-18 NOTE — Progress Notes (Signed)
Interactive audio and video telecommunications were attempted between this provider and patient, however failed, due to patient having technical difficulties OR patient did not have access to video capability.  We continued and completed visit with audio only.   Virtual Visit via Telephone Note  I connected with patient on 03/18/20  at 3:12 PM  by telephone and verified that I am speaking with the correct person using two identifiers.  Location of patient:  At work.  Location of MD: [facility] Name of referring provider (if blank then none associated): Names per persons and role in encounter:  MD: Earlyne Iba, Patient: name listed above.    I discussed the limitations, risks, security and privacy concerns of performing an evaluation and management service by telephone and the availability of in person appointments. I also discussed with the patient that there may be a patient responsible charge related to this service. The patient expressed understanding and agreed to proceed.  CC: back pain  History of Present Illness: recently started on prednisone and hydrocodone.  He has 3 days of prednisone left.  He isn't clearly worse but isn't better overall.  Still with same pain.  L sided pain.  No R sided pain.  Pain is same as prev, same type pain.  Now with some tingling on the L leg. He can still bear weight.  No FCNAVD except for vomiting and diarrhea x1 day around the time of son's concurrent and similar illness and that is resolved.  He is taking about ~1.5 tabs of hydrocodone total in a day, usually 1/2 tab BID or TID.   Used with some relief.      Observations/Objective: nad Speech wnl  Assessment and Plan: sciatica Reasonable to try gabapentin with routine cautions.   Finish prednisone.   Refer to ortho.   Use hydrocodone if needed.  He agrees with plan.  Follow Up Instructions: see above.     I discussed the assessment and treatment plan with the patient. The patient was  provided an opportunity to ask questions and all were answered. The patient agreed with the plan and demonstrated an understanding of the instructions.   The patient was advised to call back or seek an in-person evaluation if the symptoms worsen or if the condition fails to improve as anticipated.  I provided 16 minutes of non-face-to-face time during this encounter.  Elsie Stain, MD

## 2020-03-20 NOTE — Assessment & Plan Note (Signed)
sciatica Reasonable to try gabapentin with routine cautions.   Finish prednisone.   Refer to ortho.   Use hydrocodone if needed.  He agrees with plan.

## 2020-03-24 ENCOUNTER — Other Ambulatory Visit: Payer: Self-pay

## 2020-03-24 ENCOUNTER — Ambulatory Visit: Payer: Self-pay

## 2020-03-24 ENCOUNTER — Ambulatory Visit (INDEPENDENT_AMBULATORY_CARE_PROVIDER_SITE_OTHER): Payer: BC Managed Care – PPO | Admitting: Family Medicine

## 2020-03-24 ENCOUNTER — Encounter: Payer: Self-pay | Admitting: Family Medicine

## 2020-03-24 DIAGNOSIS — M5442 Lumbago with sciatica, left side: Secondary | ICD-10-CM | POA: Diagnosis not present

## 2020-03-24 NOTE — Progress Notes (Signed)
Office Visit Note   Patient: Cody Ryan           Date of Birth: 12-29-1960           MRN: [id number] Visit Date: 03/24/2020 Requested by: Tonia Ghent, MD [place],  [place] [address] PCP: Tonia Ghent, MD  Subjective: Chief Complaint  Patient presents with  . Lower Back - Pain    Pain in left lower back and down posterior leg to the foot. Started 1 month ago. NKI. Dull ache in leg and starting to have numbness/tingling. Prednisone did not help. Now on gabapentin - helps ease the pain a little.    HPI: He is here with low back and left leg pain.  Symptoms started about a month ago, no definite injury although he is very active at work and at home doing a lot of lifting.  He started feeling pain in the left posterior hip which at first radiated into the back of his hamstring, but now it goes all the way down to his foot and he feels a fairly constant numbness and tingling.  Denies any bowel or bladder dysfunction.  He has not noticed any weakness.  He cannot find a comfortable position.  He is taking gabapentin and that takes the edge off his pain.  He has had a previous mild flareup of nerve impingement type pain in the left hip which resolved after a few days of self treatment.  He is otherwise been in good health.                ROS:   All other systems were reviewed and are negative.  Objective: Vital Signs: There were no vitals taken for this visit.  Physical Exam:  General:  Alert and oriented, in no acute distress. Pulm:  Breathing unlabored. Psy:  Normal mood, congruent affect. Skin: No visible rash. Low back: He has no tenderness over the lumbar spinous processes.  He is mildly tender near the left SI joint and moderately tender in the left sciatic notch.  Straight leg raise is positive on the left, negative on the right.  Lower extremity strength is 5/5 throughout but he has absent Achilles DTR on the left, 1+ on the right.  Knee DTRs are 1+  bilaterally.    Imaging: XR Lumbar Spine 2-3 Views  Result Date: 03/24/2020 X-rays lumbar spine reveal L2-3 degenerative disc disease, mild.  There is also mild to moderate narrowing of the L5-S1 interval.  No sign of compression fracture or neoplasm.  Cam deformity of the left femoral head.   Assessment & Plan: 1.  Low back pain with left-sided sciatica, S1 distribution. -We will proceed with physical therapy at [facility], MRI lumbar spine followed by epidural injection if indicated. [employer] duty restrictions at work if able.     Procedures: No procedures performed        PMFS History: Patient Active Problem List   Diagnosis Date Noted  . Sciatica 03/14/2020  . Elbow pain 03/02/2018  . Skin lesion 10/13/2017  . Advance care planning 10/08/2016  . Routine general medical examination at a health care facility 09/26/2011  . ED (erectile dysfunction) 04/27/2011   Past Medical History:  Diagnosis Date  . Erectile dysfunction     Family History  Problem Relation Age of Onset  . Cancer Mother        Breast, treated  . Colon cancer Neg Hx   . Prostate cancer Neg Hx  Past Surgical History:  Procedure Laterality Date  . TENDON REPAIR     Right hand, 3rd finger   Social History   Occupational History  . Not on file  Tobacco Use  . Smoking status: Passive Smoke Exposure - Never Smoker  . Smokeless tobacco: Never Used  Substance and Sexual Activity  . Alcohol use: Yes    Alcohol/week: 2.0 standard drinks    Types: 2 Standard drinks or equivalent per week    Comment: 2 beers a week or less  . Drug use: No  . Sexual activity: Not on file

## 2020-03-25 ENCOUNTER — Other Ambulatory Visit: Payer: Self-pay

## 2020-03-25 MED ORDER — HYDROCODONE-ACETAMINOPHEN 5-325 MG PO TABS: 1.0000 | ORAL_TABLET | Freq: Four times a day (QID) | ORAL | 0 refills | Status: DC | PRN

## 2020-03-25 NOTE — Telephone Encounter (Signed)
Pt said he has one Norco left and pt still has pain at buttocks that runs down lt leg; gabapentin helps during the day but pt is very uncomfortable at night and needs to take the Norco at nighttime. Pt request cb today after refilled. Sending refill request to Dr Damita Dunnings and Janett Billow CMA.

## 2020-03-25 NOTE — Telephone Encounter (Signed)
Pt called requesting Rx refill on Norco. Pt reports that he has still been having a great amount of pain and has been taking Norco at bedtime...  Norco #20 last filled 03/11/2020 and is requesting refill... Please advise

## 2020-03-25 NOTE — Telephone Encounter (Signed)
Sent #30. Thanks.

## 2020-03-28 ENCOUNTER — Telehealth: Payer: Self-pay

## 2020-03-28 NOTE — Telephone Encounter (Signed)
Patient called he stated he has called [facility] in [place] and the staff there tolf him they did not receive a referral from our office he would like the referral to be resent. call back [phone]

## 2020-03-28 NOTE — Telephone Encounter (Signed)
Would you mind faxing this referral?

## 2020-03-30 NOTE — Telephone Encounter (Signed)
Referral faxed to [facility] at [phone]

## 2020-03-31 DIAGNOSIS — M5442 Lumbago with sciatica, left side: Secondary | ICD-10-CM | POA: Diagnosis not present

## 2020-04-01 ENCOUNTER — Other Ambulatory Visit: Payer: Self-pay | Admitting: Family Medicine

## 2020-04-04 ENCOUNTER — Other Ambulatory Visit: Payer: Self-pay | Admitting: Family Medicine

## 2020-04-04 NOTE — Telephone Encounter (Signed)
Refill request for Gabapentin 100 mg caps  LOV - 03/18/20 Next OV - 11/01/20 Last refill - 03/18/20 #40/1

## 2020-04-05 DIAGNOSIS — M5442 Lumbago with sciatica, left side: Secondary | ICD-10-CM | POA: Diagnosis not present

## 2020-04-05 NOTE — Telephone Encounter (Signed)
Sent. Thanks.   

## 2020-04-08 DIAGNOSIS — M5442 Lumbago with sciatica, left side: Secondary | ICD-10-CM | POA: Diagnosis not present

## 2020-04-09 ENCOUNTER — Ambulatory Visit
Admission: RE | Admit: 2020-04-09 | Discharge: 2020-04-09 | Disposition: A | Discharge status: Home / Self Care | Payer: BC Managed Care – PPO | Source: Ambulatory Visit | Attending: Family Medicine | Admitting: Family Medicine

## 2020-04-09 ENCOUNTER — Other Ambulatory Visit: Payer: Self-pay

## 2020-04-09 DIAGNOSIS — M545 Low back pain, unspecified: Secondary | ICD-10-CM | POA: Diagnosis not present

## 2020-04-09 DIAGNOSIS — M5442 Lumbago with sciatica, left side: Secondary | ICD-10-CM

## 2020-04-11 ENCOUNTER — Telehealth: Payer: Self-pay | Admitting: Family Medicine

## 2020-04-11 NOTE — Telephone Encounter (Signed)
MRI shows a disc protrusion to the left at L5-S1, contacting the left S1 nerve root.  Could consider referral for an epidural steroid injection if you'd like.

## 2020-04-12 DIAGNOSIS — M5442 Lumbago with sciatica, left side: Secondary | ICD-10-CM | POA: Diagnosis not present

## 2020-04-14 DIAGNOSIS — M5442 Lumbago with sciatica, left side: Secondary | ICD-10-CM | POA: Diagnosis not present

## 2020-04-19 DIAGNOSIS — F4320 Adjustment disorder, unspecified: Secondary | ICD-10-CM | POA: Diagnosis not present

## 2020-04-27 DIAGNOSIS — F4320 Adjustment disorder, unspecified: Secondary | ICD-10-CM | POA: Diagnosis not present

## 2020-05-03 DIAGNOSIS — F4320 Adjustment disorder, unspecified: Secondary | ICD-10-CM | POA: Diagnosis not present

## 2020-05-10 DIAGNOSIS — F4320 Adjustment disorder, unspecified: Secondary | ICD-10-CM | POA: Diagnosis not present

## 2020-05-24 DIAGNOSIS — F4320 Adjustment disorder, unspecified: Secondary | ICD-10-CM | POA: Diagnosis not present

## 2020-05-26 ENCOUNTER — Encounter: Payer: Self-pay | Admitting: Family Medicine

## 2020-05-26 DIAGNOSIS — M5442 Lumbago with sciatica, left side: Secondary | ICD-10-CM

## 2020-05-31 DIAGNOSIS — F4320 Adjustment disorder, unspecified: Secondary | ICD-10-CM | POA: Diagnosis not present

## 2020-06-03 DIAGNOSIS — M5442 Lumbago with sciatica, left side: Secondary | ICD-10-CM | POA: Diagnosis not present

## 2020-06-07 DIAGNOSIS — M5442 Lumbago with sciatica, left side: Secondary | ICD-10-CM | POA: Diagnosis not present

## 2020-06-14 DIAGNOSIS — M5442 Lumbago with sciatica, left side: Secondary | ICD-10-CM | POA: Diagnosis not present

## 2020-06-22 DIAGNOSIS — M5442 Lumbago with sciatica, left side: Secondary | ICD-10-CM | POA: Diagnosis not present

## 2020-06-29 DIAGNOSIS — M5442 Lumbago with sciatica, left side: Secondary | ICD-10-CM | POA: Diagnosis not present

## 2020-07-06 DIAGNOSIS — M5442 Lumbago with sciatica, left side: Secondary | ICD-10-CM | POA: Diagnosis not present

## 2020-07-13 DIAGNOSIS — M5442 Lumbago with sciatica, left side: Secondary | ICD-10-CM | POA: Diagnosis not present

## 2020-10-11 ENCOUNTER — Encounter: Payer: Self-pay | Admitting: Gastroenterology

## 2020-10-12 ENCOUNTER — Other Ambulatory Visit: Payer: Self-pay | Admitting: Family Medicine

## 2020-10-12 ENCOUNTER — Encounter: Payer: Self-pay | Admitting: Family Medicine

## 2020-10-12 DIAGNOSIS — E785 Hyperlipidemia, unspecified: Secondary | ICD-10-CM

## 2020-10-12 DIAGNOSIS — Z125 Encounter for screening for malignant neoplasm of prostate: Secondary | ICD-10-CM

## 2020-10-25 ENCOUNTER — Other Ambulatory Visit: Payer: Self-pay

## 2020-10-25 ENCOUNTER — Other Ambulatory Visit (INDEPENDENT_AMBULATORY_CARE_PROVIDER_SITE_OTHER): Payer: BC Managed Care – PPO

## 2020-10-25 DIAGNOSIS — Z125 Encounter for screening for malignant neoplasm of prostate: Secondary | ICD-10-CM

## 2020-10-25 DIAGNOSIS — E785 Hyperlipidemia, unspecified: Secondary | ICD-10-CM | POA: Diagnosis not present

## 2020-10-25 LAB — COMPREHENSIVE METABOLIC PANEL
ALT: 13 U/L (ref 0–53)
AST: 18 U/L (ref 0–37)
Albumin: 4.4 g/dL (ref 3.5–5.2)
Alkaline Phosphatase: 62 U/L (ref 39–117)
BUN: 15 mg/dL (ref 6–23)
CO2: 26 mEq/L (ref 19–32)
Calcium: 10.1 mg/dL (ref 8.4–10.5)
Chloride: 105 mEq/L (ref 96–112)
Creatinine, Ser: 1.23 mg/dL (ref 0.40–1.50)
GFR: 63.6 mL/min (ref >60.00)
Glucose, Bld: 114 mg/dL — ABNORMAL HIGH (ref 70–99)
Potassium: 5 mEq/L (ref 3.5–5.1)
Sodium: 139 mEq/L (ref 135–145)
Total Bilirubin: 0.3 mg/dL (ref 0.2–1.2)
Total Protein: 6.9 g/dL (ref 6.0–8.3)

## 2020-10-25 LAB — PSA: PSA: 2.31 ng/mL (ref 0.10–4.00)

## 2020-10-25 LAB — LIPID PANEL
Cholesterol: 230 mg/dL — ABNORMAL HIGH (ref 0–200)
HDL: 40.6 mg/dL (ref >39.00)
Total CHOL/HDL Ratio: 6
Triglycerides: 471 mg/dL — ABNORMAL HIGH (ref 0.0–149.0)

## 2020-10-25 LAB — LDL CHOLESTEROL, DIRECT: Direct LDL: 127 mg/dL

## 2020-11-01 ENCOUNTER — Ambulatory Visit (INDEPENDENT_AMBULATORY_CARE_PROVIDER_SITE_OTHER): Payer: BC Managed Care – PPO | Admitting: Family Medicine

## 2020-11-01 ENCOUNTER — Encounter: Payer: Self-pay | Admitting: Family Medicine

## 2020-11-01 ENCOUNTER — Other Ambulatory Visit: Payer: Self-pay

## 2020-11-01 ENCOUNTER — Encounter: Payer: BC Managed Care – PPO | Admitting: Family Medicine

## 2020-11-01 VITALS — BP 136/80 | HR 86 | Temp 97.7°F | Ht 71.0 in | Wt 170.0 lb

## 2020-11-01 DIAGNOSIS — M5430 Sciatica, unspecified side: Secondary | ICD-10-CM

## 2020-11-01 DIAGNOSIS — Z0000 Encounter for general adult medical examination without abnormal findings: Secondary | ICD-10-CM | POA: Diagnosis not present

## 2020-11-01 DIAGNOSIS — E785 Hyperlipidemia, unspecified: Secondary | ICD-10-CM

## 2020-11-01 DIAGNOSIS — Z659 Problem related to unspecified psychosocial circumstances: Secondary | ICD-10-CM

## 2020-11-01 DIAGNOSIS — N529 Male erectile dysfunction, unspecified: Secondary | ICD-10-CM

## 2020-11-01 DIAGNOSIS — Z23 Encounter for immunization: Secondary | ICD-10-CM | POA: Diagnosis not present

## 2020-11-01 DIAGNOSIS — Z7189 Other specified counseling: Secondary | ICD-10-CM

## 2020-11-01 MED ORDER — SILDENAFIL CITRATE 100 MG PO TABS: 50.0000 mg | ORAL_TABLET | Freq: Every day | ORAL | 12 refills | Status: DC | PRN

## 2020-11-01 NOTE — Patient Instructions (Addendum)
Try to work on diet and exercise and recheck labs in about 4-5 months.   Take care.  Glad to see you. Call GI when possible.

## 2020-11-01 NOTE — Progress Notes (Signed)
This visit occurred during the SARS-CoV-2 public health emergency.  Safety protocols were in place, including screening questions prior to the visit, additional usage of staff PPE, and extensive cleaning of exam room while observing appropriate contact time as indicated for disinfecting solutions.  CPE- See plan.  Routine anticipatory guidance given to patient.  See health maintenance.  The possibility exists that previously documented standard health maintenance information may have been brought forward from a previous encounter into this note.  If needed, that same information has been updated to reflect the current situation based on today's encounter.    Tetanus 2020 Flu shot 2022 covid vaccine 2021 PNA not due.  Shingrix prev done.   Prostate cancer screening and PSA options (with potential risks and benefits of testing vs not testing) were discussed along with recent recs/guidelines.  He opted testing PSA at this point. PSA d/w pt.   Colon cancer screening.  No FH colon cancer known.  letter printed for patient 2022.   Living will.  Discussed, encouraged.  Wife designated if patient were incapacitated.   HIV prev tested.  HCV prev neg.   Diet and exercise d/w pt.   H/o sciatica.  He is careful but better than prev.  D/w pt.  Some days w/o any sx.    Lipids d/w pt.  Sciatica affected his exercise routine.  He is going to try to continue working on diet and exercise.  Occ random and rare hand tremor noted.  No sx now, I asked him to monitor.  He changed jobs and is separated from his wife, stressors d/w pt.  No SI/HI.    PMH and SH reviewed  Meds, vitals, and allergies reviewed.   ROS: Per HPI.  Unless specifically indicated otherwise in HPI, the patient denies:  General: fever. Eyes: acute vision changes ENT: sore throat Cardiovascular: chest pain Respiratory: SOB GI: vomiting GU: dysuria Musculoskeletal: acute back pain Derm: acute rash Neuro: acute motor  dysfunction Psych: worsening mood Endocrine: polydipsia Heme: bleeding Allergy: hayfever  GEN: nad, alert and oriented HEENT: ncat NECK: supple w/o LA CV: rrr. PULM: ctab, no inc wob ABD: soft, +bs EXT: no edema SKIN: no acute rash No tremor noted on exam.

## 2020-11-02 DIAGNOSIS — Z659 Problem related to unspecified psychosocial circumstances: Secondary | ICD-10-CM | POA: Insufficient documentation

## 2020-11-02 NOTE — Assessment & Plan Note (Signed)
No suicidal or homicidal intent.  Okay for outpatient follow-up.  He is trying to adjust all the changes in his life.  He will let me know if the tremor gets worse.

## 2020-11-02 NOTE — Assessment & Plan Note (Signed)
He is going to work on diet and exercise and we can recheck his lipids later on.

## 2020-11-02 NOTE — Assessment & Plan Note (Signed)
Improving.  He is going to gradually work on increasing his exercise routine.

## 2020-11-02 NOTE — Assessment & Plan Note (Signed)
Tetanus 2020 Flu shot 2022 covid vaccine 2021 PNA not due.  Shingrix prev done.   Prostate cancer screening and PSA options (with potential risks and benefits of testing vs not testing) were discussed along with recent recs/guidelines.  He opted testing PSA at this point. PSA d/w pt.   Colon cancer screening.  No FH colon cancer known.  letter printed for patient 2022.   Living will.  Discussed, encouraged.  Wife designated if patient were incapacitated.   HIV prev tested.  HCV prev neg.   Diet and exercise d/w pt.

## 2020-11-02 NOTE — Assessment & Plan Note (Signed)
Continue sildenafil.  Good effect with medication, no adverse effect on medication.  Routine cautions given to patient.

## 2020-11-02 NOTE — Assessment & Plan Note (Signed)
Living will.  Discussed, encouraged.  Wife designated if patient were incapacitated.   

## 2020-11-10 ENCOUNTER — Encounter: Payer: Self-pay | Admitting: Gastroenterology

## 2020-11-22 ENCOUNTER — Ambulatory Visit (AMBULATORY_SURGERY_CENTER): Payer: BC Managed Care – PPO

## 2020-11-22 ENCOUNTER — Other Ambulatory Visit: Payer: Self-pay

## 2020-11-22 VITALS — Ht 71.0 in | Wt 170.0 lb

## 2020-11-22 DIAGNOSIS — Z1211 Encounter for screening for malignant neoplasm of colon: Secondary | ICD-10-CM

## 2020-11-22 MED ORDER — PEG-KCL-NACL-NASULF-NA ASC-C 100 G PO SOLR: 1.0000 | Freq: Once | ORAL | 0 refills | Status: AC

## 2020-11-22 NOTE — Progress Notes (Signed)

## 2020-12-01 ENCOUNTER — Encounter: Payer: Self-pay | Admitting: Gastroenterology

## 2020-12-09 ENCOUNTER — Other Ambulatory Visit: Payer: Self-pay

## 2020-12-09 ENCOUNTER — Encounter: Payer: Self-pay | Admitting: Gastroenterology

## 2020-12-09 ENCOUNTER — Ambulatory Visit (AMBULATORY_SURGERY_CENTER): Payer: BC Managed Care – PPO | Admitting: Gastroenterology

## 2020-12-09 VITALS — BP 115/74 | HR 81 | Temp 98.0°F | Resp 12 | Ht 71.0 in | Wt 170.0 lb

## 2020-12-09 DIAGNOSIS — Z1211 Encounter for screening for malignant neoplasm of colon: Secondary | ICD-10-CM

## 2020-12-09 DIAGNOSIS — D122 Benign neoplasm of ascending colon: Secondary | ICD-10-CM | POA: Diagnosis not present

## 2020-12-09 DIAGNOSIS — D124 Benign neoplasm of descending colon: Secondary | ICD-10-CM

## 2020-12-09 DIAGNOSIS — K621 Rectal polyp: Secondary | ICD-10-CM

## 2020-12-09 DIAGNOSIS — D128 Benign neoplasm of rectum: Secondary | ICD-10-CM

## 2020-12-09 DIAGNOSIS — D123 Benign neoplasm of transverse colon: Secondary | ICD-10-CM

## 2020-12-09 DIAGNOSIS — K635 Polyp of colon: Secondary | ICD-10-CM

## 2020-12-09 MED ORDER — SODIUM CHLORIDE 0.9 % IV SOLN: 500.0000 mL | Freq: Once | INTRAVENOUS | Status: DC

## 2020-12-09 NOTE — Progress Notes (Signed)
Pt Drowsy. VSS. To PACU, report to RN. No anesthetic complications noted.  

## 2020-12-09 NOTE — Progress Notes (Signed)
   Referring Provider: Tonia Ghent, MD Primary Care Physician:  Tonia Ghent, MD  Reason for Procedure:  Colon cancer surveillance   IMPRESSION:  Need for colon cancer surveillance Appropriate candidate for monitored anesthesia care  PLAN: Colonoscopy in the [facility] today   HPI: Cody Ryan is a 60 y.o. male presents for screening colonoscopy.  Normal screening colonoscopy with Dr. Sharlett Iles in 2012  No baseline GI symptoms.   No known family history of colon cancer or polyps. No family history of uterine/endometrial cancer, pancreatic cancer or gastric/stomach cancer.   Past Medical History:  Diagnosis Date   Erectile dysfunction    HLD (hyperlipidemia)     Past Surgical History:  Procedure Laterality Date   TENDON REPAIR     Right hand, 3rd finger    Current Outpatient Medications  Medication Sig Dispense Refill   cholecalciferol (VITAMIN D) 1000 units tablet Take 1,000 Units by mouth daily.     Multiple Vitamin (MULTIVITAMIN) tablet Take 1 tablet by mouth daily.     sildenafil (VIAGRA) 100 MG tablet Take 0.5-1 tablets (50-100 mg total) by mouth daily as needed for erectile dysfunction. 10 tablet 12   Current Facility-Administered Medications  Medication Dose Route Frequency Provider Last Rate Last Admin   0.9 %  sodium chloride infusion  500 mL Intravenous Once Thornton Park, MD        Allergies as of 12/09/2020   (No Known Allergies)    Family History  Problem Relation Age of Onset   Cancer Mother        Breast, treated   Colon cancer Neg Hx    Prostate cancer Neg Hx    Colon polyps Neg Hx    Esophageal cancer Neg Hx    Rectal cancer Neg Hx      Physical Exam: General:   Alert,  well-nourished, pleasant and cooperative in NAD Head:  Normocephalic and atraumatic. Eyes:  Sclera clear, no icterus.   Conjunctiva pink. Mouth:  No deformity or lesions.   Neck:  Supple; no masses or thyromegaly. Lungs:  Clear throughout to auscultation.    No wheezes. Heart:  Regular rate and rhythm; no murmurs. Abdomen:  Soft, non-tender, nondistended, normal bowel sounds, no rebound or guarding.  Msk:  Symmetrical. No boney deformities LAD: No inguinal or umbilical LAD Extremities:  No clubbing or edema. Neurologic:  Alert and  oriented x4;  grossly nonfocal Skin:  No obvious rash or bruise. Psych:  Alert and cooperative. Normal mood and affect.     Mohamadou Maciver L. Tarri Glenn, MD, MPH 12/09/2020, 8:06 AM

## 2020-12-09 NOTE — Progress Notes (Signed)
Called to room to assist during endoscopic procedure.  Patient ID and intended procedure confirmed with present staff. Received instructions for my participation in the procedure from the performing physician.  

## 2020-12-09 NOTE — Progress Notes (Signed)
VS by CW  Pt's states no medical or surgical changes since previsit or office visit.  

## 2020-12-09 NOTE — Op Note (Signed)
[facility] Patient Name: Cody Ryan Procedure Date: 12/09/2020 7:03 AM MRN: [id number] Endoscopist: Thornton Park MD, MD Age: 60 Referring MD:  Date of Birth: Nov 29, 1960 Gender: Male Account #: [id number] Procedure:                Colonoscopy Indications:              Screening for colorectal malignant neoplasm                           Normal screening colonoscopy with Dr. Sharlett Iles 2012                           No known family history of colon cancer or polyps Medicines:                Monitored Anesthesia Care Procedure:                Pre-Anesthesia Assessment:                           - Prior to the procedure, a History and Physical                            was performed, and patient medications and                            allergies were reviewed. The patient's tolerance of                            previous anesthesia was also reviewed. The risks                            and benefits of the procedure and the sedation                            options and risks were discussed with the patient.                            All questions were answered, and informed consent                            was obtained. Prior Anticoagulants: The patient has                            taken no previous anticoagulant or antiplatelet                            agents. ASA Grade Assessment: I - A normal, healthy                            patient. After reviewing the risks and benefits,                            the patient was deemed in satisfactory condition to  undergo the procedure.                           After obtaining informed consent, the colonoscope                            was passed under direct vision. Throughout the                            procedure, the patient's blood pressure, pulse, and                            oxygen saturations were monitored continuously. The                            Olympus CF-HQ190L  [id number]) Colonoscope was                            introduced through the anus and advanced to the 2                            cm into the ileum. A second forward view of the                            right colon was performed. The colonoscopy was                            performed without difficulty. The patient tolerated                            the procedure well. The quality of the bowel                            preparation was good. The terminal ileum, ileocecal                            valve, appendiceal orifice, and rectum were                            photographed. Scope In: 8:13:05 AM Scope Out: 8:27:32 AM Scope Withdrawal Time: 0 hours 11 minutes 14 seconds  Total Procedure Duration: 0 hours 14 minutes 27 seconds  Findings:                 The perianal and digital rectal examinations were                            normal.                           Many small and large-mouthed diverticula were found                            in the sigmoid colon.  A 1 mm polyp was found in the rectum. The polyp was                            flat. The polyp was removed with a cold snare.                            Resection and retrieval were complete. Estimated                            blood loss was minimal.                           Two sessile polyps were found in the descending                            colon. The polyps were 2 to 3 mm in size. These                            polyps were removed with a cold snare. Resection                            and retrieval were complete. Estimated blood loss                            was minimal.                           A 4 mm polyp was found in the splenic flexure. The                            polyp was sessile. The polyp was removed with a                            cold snare. Resection and retrieval were complete.                            Estimated blood loss was minimal.                            A 2 mm polyp was found in the ascending colon. The                            polyp was sessile. The polyp was removed with a                            cold snare. Resection and retrieval were complete.                            Estimated blood loss was minimal.                           The exam was otherwise without abnormality on  direct and retroflexion views. Complications:            No immediate complications. Estimated blood loss:                            Minimal. Estimated Blood Loss:     Estimated blood loss was minimal. Impression:               - Non-bleeding internal hemorrhoids.                           - Diverticulosis in the sigmoid colon.                           - One 1 mm polyp in the rectum, removed with a cold                            snare. Resected and retrieved.                           - Two 2 to 3 mm polyps in the descending colon,                            removed with a cold snare. Resected and retrieved.                           - One 4 mm polyp at the splenic flexure, removed                            with a cold snare. Resected and retrieved.                           - One 2 mm polyp in the ascending colon, removed                            with a cold snare. Resected and retrieved.                           - The examination was otherwise normal on direct                            and retroflexion views. Recommendation:           - Patient has a contact number available for                            emergencies. The signs and symptoms of potential                            delayed complications were discussed with the                            patient. Return to normal activities tomorrow.  Written discharge instructions were provided to the                            patient.                           - Resume previous diet. High fiber diet recommended.                           -  Continue present medications.                           - Await pathology results.                           - Repeat colonoscopy date to be determined after                            pending pathology results are reviewed for                            surveillance. If at least 3 polyps are adenomas,                            repeat colonoscopy in 3 years.                           - Emerging evidence supports eating a diet of                            fruits, vegetables, grains, calcium, and yogurt                            while reducing red meat and alcohol may reduce the                            risk of colon cancer.                           - Thank you for allowing me to be involved in your                            colon cancer prevention. Thornton Park MD, MD 12/09/2020 8:33:42 AM This report has been signed electronically.

## 2020-12-09 NOTE — Patient Instructions (Signed)
Read all of the handouts given to you by your recovery room nurse.  YOU HAD AN ENDOSCOPIC PROCEDURE TODAY AT THE [facility]:   Refer to the procedure report that was given to you for any specific questions about what was found during the examination.  If the procedure report does not answer your questions, please call your gastroenterologist to clarify.  If you requested that your care partner not be given the details of your procedure findings, then the procedure report has been included in a sealed envelope for you to review at your convenience later.  YOU SHOULD EXPECT: Some feelings of bloating in the abdomen. Passage of more gas than usual.  Walking can help get rid of the air that was put into your GI tract during the procedure and reduce the bloating. If you had a lower endoscopy (such as a colonoscopy or flexible sigmoidoscopy) you may notice spotting of blood in your stool or on the toilet paper. If you underwent a bowel prep for your procedure, you may not have a normal bowel movement for a few days.  Please Note:  You might notice some irritation and congestion in your nose or some drainage.  This is from the oxygen used during your procedure.  There is no need for concern and it should clear up in a day or so.  SYMPTOMS TO REPORT IMMEDIATELY:  Following lower endoscopy (colonoscopy or flexible sigmoidoscopy):  Excessive amounts of blood in the stool  Significant tenderness or worsening of abdominal pains  Swelling of the abdomen that is new, acute  Fever of 100F or higher   For urgent or emergent issues, a gastroenterologist can be reached at any hour by calling [phone]. Do not use MyChart messaging for urgent concerns.    DIET:  We do recommend a small meal at first, but then you may proceed to your regular diet.  Drink plenty of fluids but you should avoid alcoholic beverages for 24 hours. Try to increase the fiber in your diet, and drink plenty of  water.  ACTIVITY:  You should plan to take it easy for the rest of today and you should NOT DRIVE or use heavy machinery until tomorrow (because of the sedation medicines used during the test).    FOLLOW UP: Our staff will call the number listed on your records 48-72 hours following your procedure to check on you and address any questions or concerns that you may have regarding the information given to you following your procedure. If we do not reach you, we will leave a message.  We will attempt to reach you two times.  During this call, we will ask if you have developed any symptoms of COVID 19. If you develop any symptoms (ie: fever, flu-like symptoms, shortness of breath, cough etc.) before then, please call [phone].  If you test positive for Covid 19 in the 2 weeks post procedure, please call and report this information to us.    If any biopsies were taken you will be contacted by phone or by letter within the next 1-3 weeks.  Please call us at [phone] if you have not heard about the biopsies in 3 weeks.    SIGNATURES/CONFIDENTIALITY: You and/or your care partner have signed paperwork which will be entered into your electronic medical record.  These signatures attest to the fact that that the information above on your After Visit Summary has been reviewed and is understood.  Full responsibility of the confidentiality of this   discharge information lies with you and/or your care-partner.  

## 2020-12-13 ENCOUNTER — Telehealth: Payer: Self-pay

## 2020-12-13 DIAGNOSIS — H401132 Primary open-angle glaucoma, bilateral, moderate stage: Secondary | ICD-10-CM | POA: Diagnosis not present

## 2020-12-13 NOTE — Telephone Encounter (Signed)
  Follow up Call-  Call back number 12/09/2020  Post procedure Call Back phone  # [phone]  Permission to leave phone message Yes  Some recent data might be hidden     Patient questions:  Do you have a fever, pain , or abdominal swelling? No. Pain Score  0 *  Have you tolerated food without any problems? Yes.    Have you been able to return to your normal activities? Yes.    Do you have any questions about your discharge instructions: Diet   No. Medications  No. Follow up visit  No.  Do you have questions or concerns about your Care? No.  Actions: * If pain score is 4 or above: No action needed, pain <4.

## 2020-12-18 ENCOUNTER — Encounter: Payer: Self-pay | Admitting: Gastroenterology

## 2021-01-10 DIAGNOSIS — H40003 Preglaucoma, unspecified, bilateral: Secondary | ICD-10-CM | POA: Diagnosis not present

## 2021-03-08 ENCOUNTER — Other Ambulatory Visit: Payer: BC Managed Care – PPO

## 2021-03-10 ENCOUNTER — Other Ambulatory Visit: Payer: BC Managed Care – PPO

## 2021-08-21 ENCOUNTER — Encounter: Payer: Self-pay | Admitting: Family Medicine

## 2021-08-21 ENCOUNTER — Ambulatory Visit (INDEPENDENT_AMBULATORY_CARE_PROVIDER_SITE_OTHER): Payer: BC Managed Care – PPO | Admitting: Family Medicine

## 2021-08-21 DIAGNOSIS — H6980 Other specified disorders of Eustachian tube, unspecified ear: Secondary | ICD-10-CM | POA: Diagnosis not present

## 2021-08-21 MED ORDER — PREDNISONE 10 MG PO TABS: ORAL_TABLET | ORAL | 0 refills | Status: DC

## 2021-08-21 NOTE — Progress Notes (Signed)
He is a Forensic scientist.  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  He still has pressure 1 month out and cracking sound noted in the ears.  He got down to 60 ft depth when diving about a month ago.  Safe trip o/w in [place].   No FCNAVD now.  Prev GI  upset resolved.    Meds, vitals, and allergies reviewed.   ROS: Per HPI unless specifically indicated in ROS section   Nad Ncat TM wnl B on inspection but does not have normal TM movement with Valsalva. Neck supple, no LA Rrr ctab

## 2021-08-23 DIAGNOSIS — H6980 Other specified disorders of Eustachian tube, unspecified ear: Secondary | ICD-10-CM | POA: Insufficient documentation

## 2021-08-23 NOTE — Assessment & Plan Note (Signed)
Discussed anatomy, take prednisone with routine steroid cautions and gently perform Valsalva and update me as needed.  He agrees to plan.

## 2021-09-25 IMAGING — MR MR LUMBAR SPINE W/O CM
4 of 5 series · 26 of 48 positions shown · non-contrast
Comparison: Lumbar spine radiographs 03/24/2020.

CLINICAL DATA: Acute left-sided low back pain with left-sided
sciatica. Lumbar radiculopathy, no red flags.

EXAM:
MRI LUMBAR SPINE WITHOUT CONTRAST
TECHNIQUE: Multiplanar, multisequence MR imaging of the lumbar spine was
performed. No intravenous contrast was administered.

[Series 3: T2 · sagittal · 4.0mm · 1.09mm/px · 6 of 17 slices shown (1 of 2)]
[im 1/17]
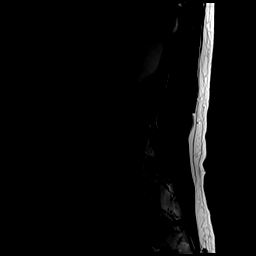
[im 4/17]
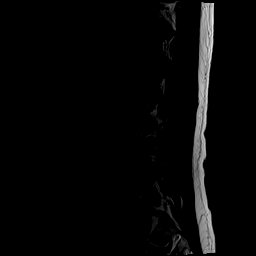
[im 7/17]
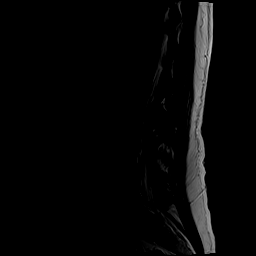
[im 10/17]
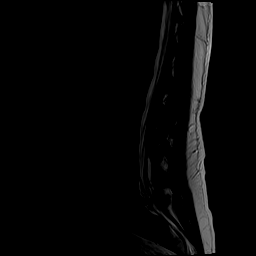
[im 13/17]
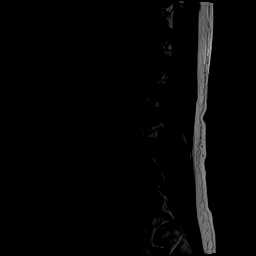
[im 17/17]
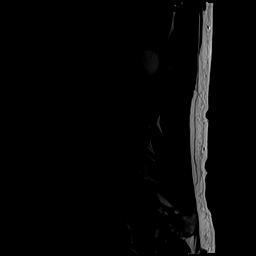

[Series 5: T1 · sagittal · 4.0mm · 1.09mm/px · 6 of 17 slices shown (1 of 2)]
[im 1/17]
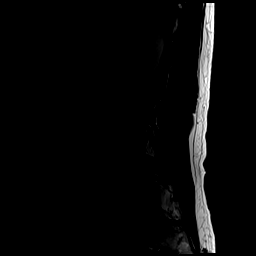
[im 4/17]
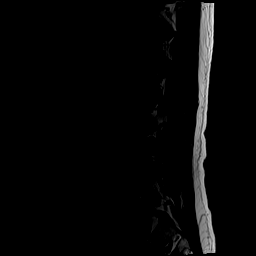
[im 7/17]
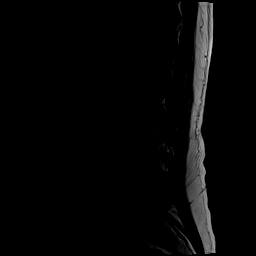
[im 10/17]
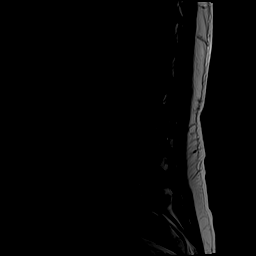
[im 13/17]
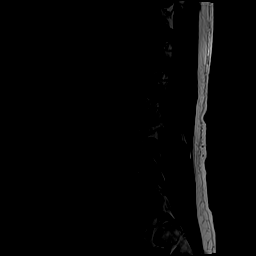
[im 17/17]
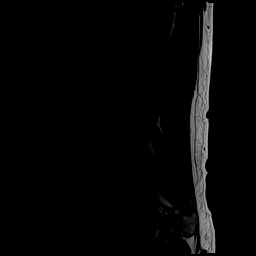

[Series 6: T2 · axial · 4.0mm · 0.39mm/px · z∈[-160,+55]mm · 9 of 42 slices shown (2 of 2)]
[im 1/42]
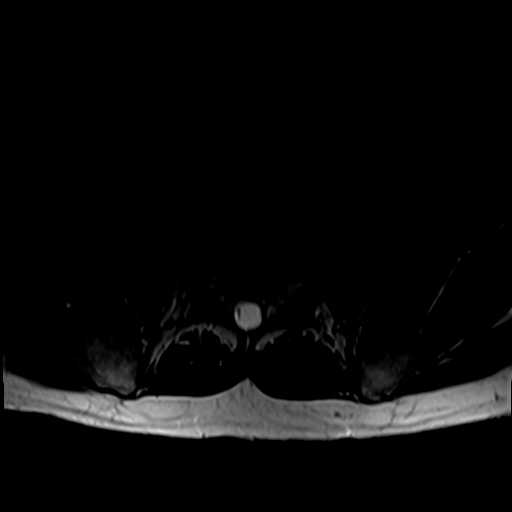
[im 6/42]
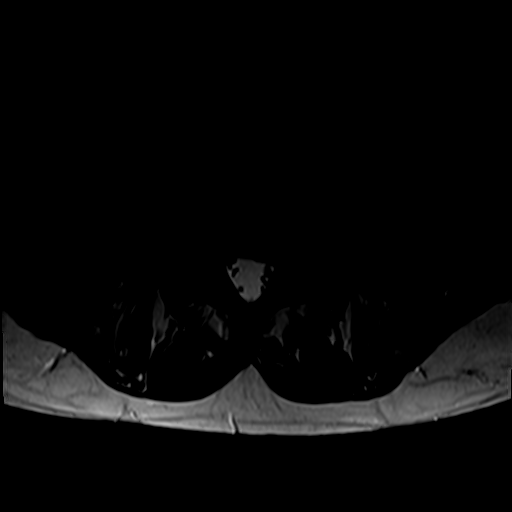
[im 12/42]
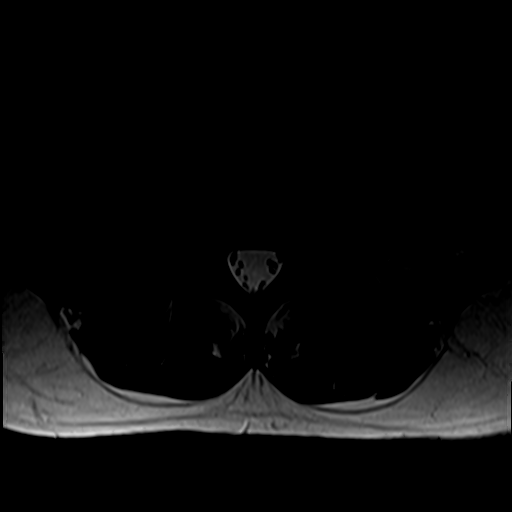
[im 18/42]
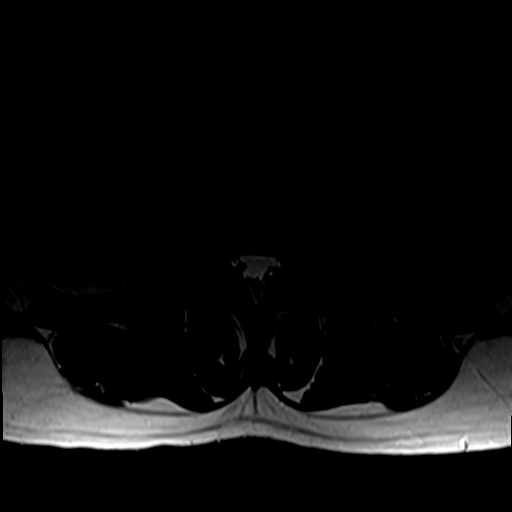
[im 21/42]
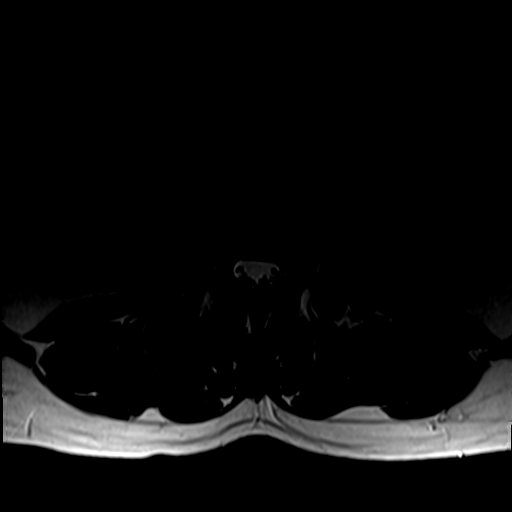
[im 24/42]
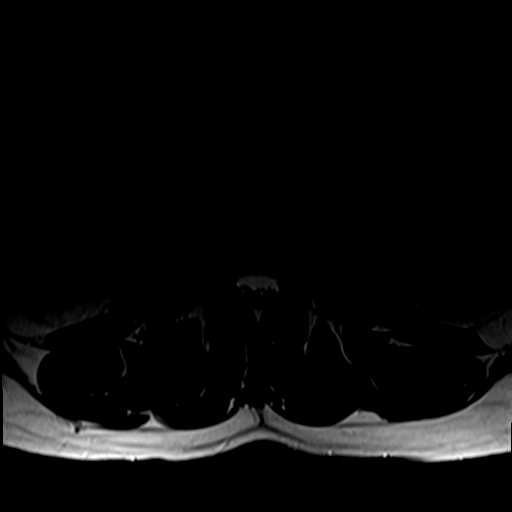
[im 30/42]
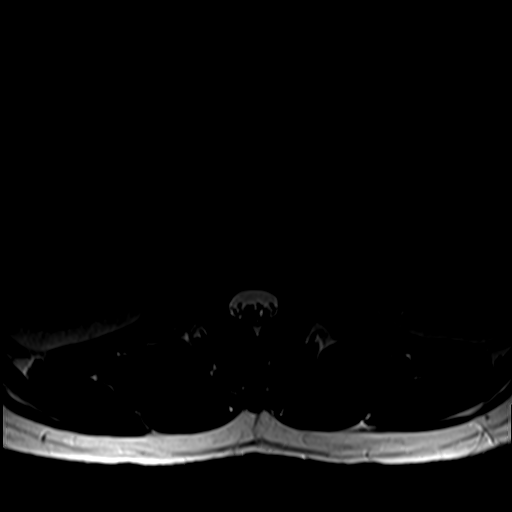
[im 36/42]
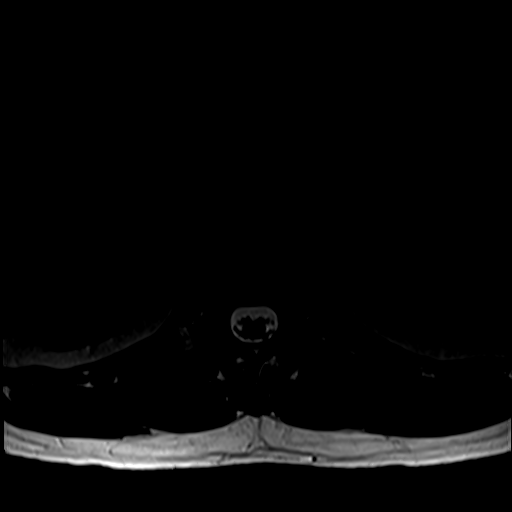
[im 42/42]
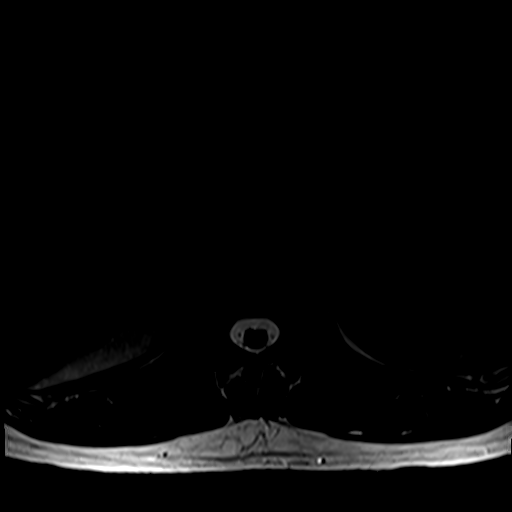

[Series 7: T1 · axial · 4.0mm · 0.39mm/px · z∈[-160,+26]mm · 5 of 42 slices shown (2 of 2)]
[im 1/42]
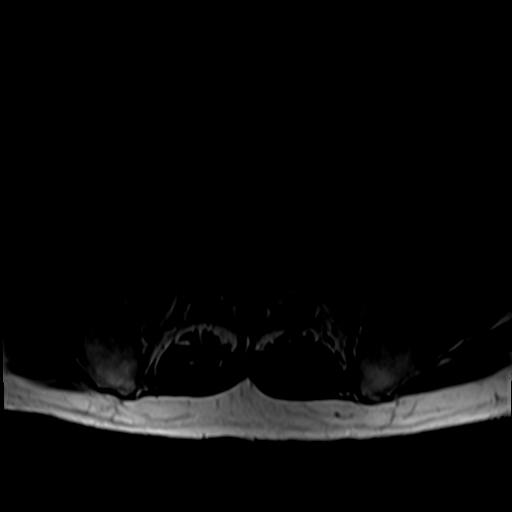
[im 6/42]
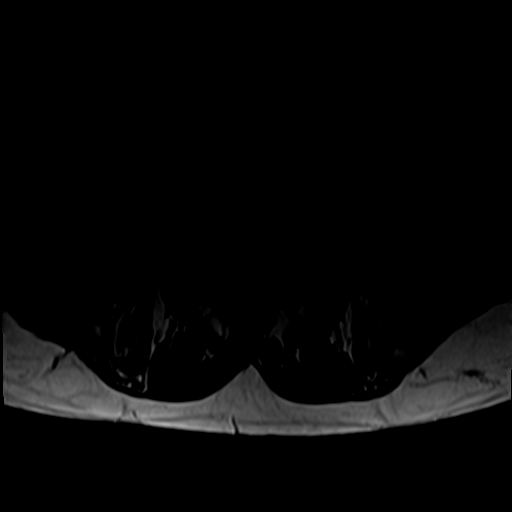
[im 12/42]
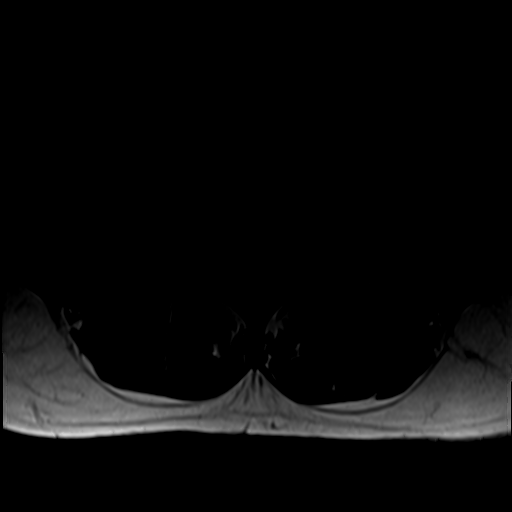
[im 21/42]
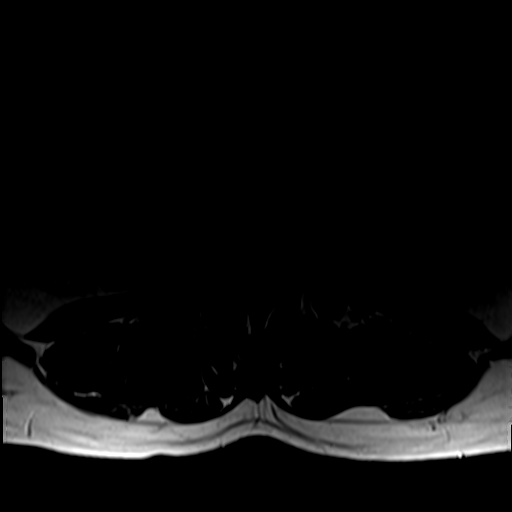
[im 36/42]
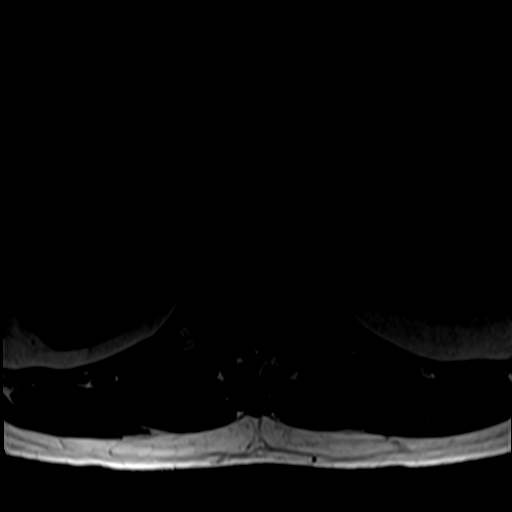

[26 of 48 positions shown; findings below may reference images not displayed]

FINDINGS: Segmentation: 5 lumbar vertebrae (correlating with prior lumbar
spine radiographs 03/24/2020).

Alignment:  Trace L2-L3 grade 1 retrolisthesis.

Vertebrae: Mild chronic L1 superior endplate compression deformity.
Vertebral body height is otherwise maintained. Multilevel small
Schmorl nodes. No significant marrow edema or focal suspicious
osseous lesion.

Conus medullaris and cauda equina: Conus extends to the L2 level. No
signal abnormality within the visualized distal spinal cord.

Paraspinal and other soft tissues: No abnormality identified within
included portions of the abdomen/retroperitoneum. Paraspinal soft
tissues within normal limits.

Disc levels:

Mild multilevel disc degeneration greatest at L2-L3 and L5-S1.

T12-L1: No significant disc herniation or stenosis.

L1-L2: No significant disc herniation or stenosis.

L2-L3: Trace retrolisthesis. Disc bulge. Mild facet arthrosis.
Minimal bilateral subarticular narrowing without appreciable nerve
root impingement. Central canal patent. Mild bilateral neural
foraminal narrowing.

L3-L4: Small disc bulge. Mild facet arthrosis. Minimal bilateral
subarticular narrowing without appreciable nerve root impingement.
Mild bilateral neural foraminal narrowing.

L4-L5: Mild endplate spurring and facet arthrosis. No significant
spinal canal stenosis. Borderline mild right neural foraminal
narrowing.

L5-S1: Disc bulge with endplate spurring. Superimposed broad-based
central to left subarticular disc protrusion. Mild facet arthrosis.
The disc protrusion contributes to mild left subarticular narrowing
and contacts the descending left S1 nerve root (series 3, image 10)
(series 6, image 38). Central canal patent. Moderate bilateral
neural foraminal narrowing.
IMPRESSION: Lumbar spondylosis as outlined and with findings most notably as
follows.

At L5-S1, there is mild disc degeneration. Disc bulge with endplate
spurring. Superimposed broad-based central to left subarticular disc
protrusion. Mild facet arthrosis. The disc protrusion contributes to
mild left subarticular narrowing and contacts the descending left S1
nerve root. Correlate for left S1 radiculopathy. Moderate bilateral
neural foraminal narrowing.

No more than mild spinal canal or neural foraminal narrowing at the
remaining levels.

Mild chronic L1 superior endplate compression deformity.

## 2021-11-09 ENCOUNTER — Other Ambulatory Visit: Payer: Self-pay | Admitting: Family Medicine

## 2021-11-09 DIAGNOSIS — E785 Hyperlipidemia, unspecified: Secondary | ICD-10-CM

## 2021-11-09 DIAGNOSIS — Z125 Encounter for screening for malignant neoplasm of prostate: Secondary | ICD-10-CM

## 2021-11-10 ENCOUNTER — Other Ambulatory Visit (INDEPENDENT_AMBULATORY_CARE_PROVIDER_SITE_OTHER): Payer: BC Managed Care – PPO

## 2021-11-10 ENCOUNTER — Encounter: Payer: BC Managed Care – PPO | Admitting: Family Medicine

## 2021-11-10 ENCOUNTER — Other Ambulatory Visit: Payer: BC Managed Care – PPO

## 2021-11-10 DIAGNOSIS — E785 Hyperlipidemia, unspecified: Secondary | ICD-10-CM | POA: Diagnosis not present

## 2021-11-10 DIAGNOSIS — Z125 Encounter for screening for malignant neoplasm of prostate: Secondary | ICD-10-CM

## 2021-11-10 LAB — COMPREHENSIVE METABOLIC PANEL
ALT: 13 U/L (ref 0–53)
AST: 18 U/L (ref 0–37)
Albumin: 4.3 g/dL (ref 3.5–5.2)
Alkaline Phosphatase: 57 U/L (ref 39–117)
BUN: 15 mg/dL (ref 6–23)
CO2: 28 mEq/L (ref 19–32)
Calcium: 9.9 mg/dL (ref 8.4–10.5)
Chloride: 105 mEq/L (ref 96–112)
Creatinine, Ser: 0.99 mg/dL (ref 0.40–1.50)
GFR: 81.92 mL/min (ref >60.00)
Glucose, Bld: 112 mg/dL — ABNORMAL HIGH (ref 70–99)
Potassium: 4.9 mEq/L (ref 3.5–5.1)
Sodium: 141 mEq/L (ref 135–145)
Total Bilirubin: 0.4 mg/dL (ref 0.2–1.2)
Total Protein: 7 g/dL (ref 6.0–8.3)

## 2021-11-10 LAB — LIPID PANEL
Cholesterol: 202 mg/dL — ABNORMAL HIGH (ref 0–200)
HDL: 49.7 mg/dL (ref >39.00)
LDL Cholesterol: 120 mg/dL — ABNORMAL HIGH (ref 0–99)
NonHDL: 152.51
Total CHOL/HDL Ratio: 4
Triglycerides: 164 mg/dL — ABNORMAL HIGH (ref 0.0–149.0)
VLDL: 32.8 mg/dL (ref 0.0–40.0)

## 2021-11-10 LAB — PSA: PSA: 2.02 ng/mL (ref 0.10–4.00)

## 2021-11-10 NOTE — Progress Notes (Signed)
Labs only

## 2021-11-17 ENCOUNTER — Ambulatory Visit (INDEPENDENT_AMBULATORY_CARE_PROVIDER_SITE_OTHER): Payer: BC Managed Care – PPO | Admitting: Family Medicine

## 2021-11-17 ENCOUNTER — Encounter: Payer: Self-pay | Admitting: Family Medicine

## 2021-11-17 VITALS — BP 112/64 | HR 88 | Temp 97.8°F | Ht 71.0 in | Wt 170.0 lb

## 2021-11-17 DIAGNOSIS — Z0000 Encounter for general adult medical examination without abnormal findings: Secondary | ICD-10-CM

## 2021-11-17 DIAGNOSIS — Z23 Encounter for immunization: Secondary | ICD-10-CM

## 2021-11-17 DIAGNOSIS — Z7189 Other specified counseling: Secondary | ICD-10-CM

## 2021-11-17 DIAGNOSIS — N529 Male erectile dysfunction, unspecified: Secondary | ICD-10-CM

## 2021-11-17 MED ORDER — SILDENAFIL CITRATE 100 MG PO TABS: 50.0000 mg | ORAL_TABLET | Freq: Every day | ORAL | 12 refills | Status: DC | PRN

## 2021-11-17 NOTE — Patient Instructions (Addendum)
Update me as needed.  Take care.  Glad to see you. Recheck yearly at a physical.

## 2021-11-17 NOTE — Progress Notes (Unsigned)
CPE- See plan.  Routine anticipatory guidance given to patient.  See health maintenance.  The possibility exists that previously documented standard health maintenance information may have been brought forward from a previous encounter into this note.  If needed, that same information has been updated to reflect the current situation based on today's encounter.    Tetanus 2020 Flu shot 2023 covid vaccine 2021 PNA not due.  Shingrix prev done.   Prostate cancer screening and PSA options (with potential risks and benefits of testing vs not testing) were discussed along with recent recs/guidelines.  He opted testing PSA at this point. PSA d/w pt.   Colonoscopy 2022.   Living will.  Discussed, encouraged.  Wife designated if patient were incapacitated.   HIV prev tested.  HCV prev neg.   Diet and exercise d/w pt.   ED.  Improved with sildenafil.  No ADE on med.    He has a positional L handed tremor at his desk but not o/w.  Only in a specific position, ie with typing.  D/w pt about his work station.    PMH and SH reviewed  Meds, vitals, and allergies reviewed.   ROS: Per HPI.  Unless specifically indicated otherwise in HPI, the patient denies:  General: fever. Eyes: acute vision changes ENT: sore throat Cardiovascular: chest pain Respiratory: SOB GI: vomiting GU: dysuria Musculoskeletal: acute back pain Derm: acute rash Neuro: acute motor dysfunction Psych: worsening mood Endocrine: polydipsia Heme: bleeding Allergy: hayfever  GEN: nad, alert and oriented HEENT: ncat NECK: supple w/o LA CV: rrr. PULM: ctab, no inc wob ABD: soft, +bs EXT: no edema SKIN: no acute rash

## 2021-11-20 NOTE — Assessment & Plan Note (Signed)
Living will.  Discussed, encouraged.  Wife designated if patient were incapacitated.

## 2021-11-20 NOTE — Assessment & Plan Note (Signed)
Tetanus 2020 Flu shot 2023 covid vaccine 2021 PNA not due.  Shingrix prev done.   Prostate cancer screening and PSA options (with potential risks and benefits of testing vs not testing) were discussed along with recent recs/guidelines.  He opted testing PSA at this point. PSA d/w pt.   Colonoscopy 2022.   Living will.  Discussed, encouraged.  Wife designated if patient were incapacitated.   HIV prev tested.  HCV prev neg.   Diet and exercise d/w pt.

## 2021-11-20 NOTE — Assessment & Plan Note (Signed)
Continue as needed sildenafil. 

## 2022-07-05 ENCOUNTER — Ambulatory Visit
Admission: EM | Admit: 2022-07-05 | Discharge: 2022-07-05 | Disposition: A | Discharge status: Home / Self Care | Payer: BC Managed Care – PPO | Attending: Emergency Medicine | Admitting: Emergency Medicine

## 2022-07-05 DIAGNOSIS — L089 Local infection of the skin and subcutaneous tissue, unspecified: Secondary | ICD-10-CM

## 2022-07-05 DIAGNOSIS — S61317A Laceration without foreign body of left little finger with damage to nail, initial encounter: Secondary | ICD-10-CM

## 2022-07-05 MED ORDER — CEPHALEXIN 500 MG PO CAPS: 500.0000 mg | ORAL_CAPSULE | Freq: Four times a day (QID) | ORAL | 0 refills | Status: DC

## 2022-07-05 NOTE — Discharge Instructions (Addendum)
Take the cephalexin as directed.    Keep your wound clean and dry.  Wash it gently twice a day with soap and water.  Apply an antibiotic cream twice a day.  Follow up with your primary care provider.    Follow up right away if you see signs of worseing infection, such as increased redness, pus-like drainage, fever, or other concerning symptoms.

## 2022-07-05 NOTE — ED Triage Notes (Signed)
Patient to Urgent Care with complaints of laceration present left little finger. Reports cutting his finger on a pairing knife on Sunday.   Applied spray bandage and has also been applying band-aids.   Reports concerns today because his finger is green. No drainage/ bleeding/ pain. Reports it could possibly be green from the liquid bandage.

## 2022-07-05 NOTE — ED Provider Notes (Signed)
Renaldo Fiddler    CSN: [id number] Arrival date & time: 07/05/22  1457      History   Chief Complaint Chief Complaint  Patient presents with   Finger Injury    HPI Cody Ryan is a 62 y.o. male.  Patient presents with a laceration on the tip of his left little finger that occurred on 07/01/2022.  He was using a paring knife and accidentally cut his finger.  He applied liquid bandage and Band-Aids.  He is concerned today because he took off the Band-Aid today and the liquid bandage appears to be green.  No wound drainage, fever, chills, numbness, weakness, or other symptoms.  Last tetanus 07/24/2018.    The history is provided by the patient and medical records.    Past Medical History:  Diagnosis Date   Erectile dysfunction    HLD (hyperlipidemia)     Patient Active Problem List   Diagnosis Date Noted   ETD (eustachian tube dysfunction) 08/23/2021   HLD (hyperlipidemia) 10/12/2020   Sciatica 03/14/2020   Elbow pain 03/02/2018   Skin lesion 10/13/2017   Advance care planning 10/08/2016   Routine general medical examination at a health care facility 09/26/2011   ED (erectile dysfunction) 04/27/2011    Past Surgical History:  Procedure Laterality Date   TENDON REPAIR     Right hand, 3rd finger       Home Medications    Prior to Admission medications   Medication Sig Start Date End Date Taking? Authorizing Provider  cephALEXin (KEFLEX) 500 MG capsule Take 1 capsule (500 mg total) by mouth 4 (four) times daily. 07/05/22  Yes Mickie Bail, NP  cholecalciferol (VITAMIN D) 1000 units tablet Take 1,000 Units by mouth daily.    [provider]  Multiple Vitamin (MULTIVITAMIN) tablet Take 1 tablet by mouth daily.    [provider]  sildenafil (VIAGRA) 100 MG tablet Take 0.5-1 tablets (50-100 mg total) by mouth daily as needed for erectile dysfunction. 11/17/21 05/18/22  Joaquim Nam, MD    Family History Family History  Problem Relation Age of  Onset   Cancer Mother        Breast, treated   Colon cancer Neg Hx    Prostate cancer Neg Hx    Colon polyps Neg Hx    Esophageal cancer Neg Hx    Rectal cancer Neg Hx     Social History Social History   Tobacco Use   Smoking status: Never    Passive exposure: Yes   Smokeless tobacco: Never  Vaping Use   Vaping Use: Never used  Substance Use Topics   Alcohol use: Yes    Alcohol/week: 2.0 standard drinks of alcohol    Types: 2 Standard drinks or equivalent per week    Comment: 2 beers a week or less   Drug use: No     Allergies   Patient has no known allergies.   Review of Systems Review of Systems  Constitutional:  Negative for chills and fever.  Musculoskeletal:  Negative for arthralgias and joint swelling.  Skin:  Positive for color change and wound.  Neurological:  Negative for weakness and numbness.     Physical Exam Triage Vital Signs ED Triage Vitals  Enc Vitals Group     BP 07/05/22 1507 (!) 147/85     Pulse Rate 07/05/22 1507 94     Resp 07/05/22 1507 18     Temp 07/05/22 1507 97.8 F (36.6 C)  Temp src --      SpO2 07/05/22 1507 98 %     Weight --      Height --      Head Circumference --      Peak Flow --      Pain Score 07/05/22 1506 0     Pain Loc --      Pain Edu? --      Excl. in GC? --    No data found.  Updated Vital Signs BP (!) 147/85   Pulse 94   Temp 97.8 F (36.6 C)   Resp 18   SpO2 98%   Visual Acuity Right Eye Distance:   Left Eye Distance:   Bilateral Distance:    Right Eye Near:   Left Eye Near:    Bilateral Near:     Physical Exam Vitals and nursing note reviewed.  Constitutional:      General: He is not in acute distress.    Appearance: He is well-developed. He is not ill-appearing.  HENT:     Head: Normocephalic and atraumatic.     Mouth/Throat:     Mouth: Mucous membranes are moist.  Cardiovascular:     Rate and Rhythm: Normal rate and regular rhythm.  Pulmonary:     Effort: Pulmonary effort is  normal. No respiratory distress.  Musculoskeletal:        General: No deformity. Normal range of motion.     Cervical back: Neck supple.  Skin:    General: Skin is warm and dry.     Capillary Refill: Capillary refill takes less than 2 seconds.     Findings: Erythema and lesion present.     Comments: Small avulsion laceration on tip of left little finger.  No drainage.  Mild erythema.  See pictures.  Neurological:     General: No focal deficit present.     Mental Status: He is alert and oriented to person, place, and time.     Sensory: No sensory deficit.     Motor: No weakness.  Psychiatric:        Mood and Affect: Mood normal.        Behavior: Behavior normal.         UC Treatments / Results  Labs (all labs ordered are listed, but only abnormal results are displayed) Labs Reviewed - No data to display  EKG   Radiology No results found.  Procedures Procedures (including critical care time)  Medications Ordered in UC Medications - No data to display  Initial Impression / Assessment and Plan / UC Course  I have reviewed the triage vital signs and the nursing notes.  Pertinent labs & imaging results that were available during my care of the patient were reviewed by me and considered in my medical decision making (see chart for details).    Laceration and infection of left little finger.  The wound has mild erythema surrounding it.  The green streaks appear to be the liquid bandage.  See pictures.  Treating today with cephalexin.  Wound care instructions discussed.  Instructed patient to be seen right away if he notes signs of worsening infection.  Education provided on wound care.  Instructed patient to follow-up with his PCP.  He agrees to plan of care.  Final Clinical Impressions(s) / UC Diagnoses   Final diagnoses:  Infection of finger  Laceration of left little finger without foreign body with damage to nail, initial encounter     Discharge Instructions  Take the cephalexin as directed.    Keep your wound clean and dry.  Wash it gently twice a day with soap and water.  Apply an antibiotic cream twice a day.  Follow up with your primary care provider.    Follow up right away if you see signs of worseing infection, such as increased redness, pus-like drainage, fever, or other concerning symptoms.        ED Prescriptions     Medication Sig Dispense Auth. Provider   cephALEXin (KEFLEX) 500 MG capsule Take 1 capsule (500 mg total) by mouth 4 (four) times daily. 28 capsule Mickie Bail, NP      PDMP not reviewed this encounter.   Mickie Bail, NP 07/05/22 [phone]

## 2022-10-11 ENCOUNTER — Encounter (INDEPENDENT_AMBULATORY_CARE_PROVIDER_SITE_OTHER): Payer: Self-pay

## 2022-11-11 ENCOUNTER — Other Ambulatory Visit: Payer: Self-pay | Admitting: Family Medicine

## 2022-11-11 DIAGNOSIS — Z125 Encounter for screening for malignant neoplasm of prostate: Secondary | ICD-10-CM

## 2022-11-11 DIAGNOSIS — E785 Hyperlipidemia, unspecified: Secondary | ICD-10-CM

## 2022-11-12 ENCOUNTER — Other Ambulatory Visit (INDEPENDENT_AMBULATORY_CARE_PROVIDER_SITE_OTHER): Payer: BC Managed Care – PPO

## 2022-11-12 DIAGNOSIS — Z125 Encounter for screening for malignant neoplasm of prostate: Secondary | ICD-10-CM | POA: Diagnosis not present

## 2022-11-12 DIAGNOSIS — E785 Hyperlipidemia, unspecified: Secondary | ICD-10-CM

## 2022-11-12 LAB — COMPREHENSIVE METABOLIC PANEL
ALT: 19 U/L (ref 0–53)
AST: 26 U/L (ref 0–37)
Albumin: 4.2 g/dL (ref 3.5–5.2)
Alkaline Phosphatase: 57 U/L (ref 39–117)
BUN: 20 mg/dL (ref 6–23)
CO2: 25 meq/L (ref 19–32)
Calcium: 9 mg/dL (ref 8.4–10.5)
Chloride: 107 meq/L (ref 96–112)
Creatinine, Ser: 1.1 mg/dL (ref 0.40–1.50)
GFR: 71.68 mL/min (ref >60.00)
Glucose, Bld: 90 mg/dL (ref 70–99)
Potassium: 5.2 meq/L — ABNORMAL HIGH (ref 3.5–5.1)
Sodium: 138 meq/L (ref 135–145)
Total Bilirubin: 0.5 mg/dL (ref 0.2–1.2)
Total Protein: 6.5 g/dL (ref 6.0–8.3)

## 2022-11-12 LAB — LIPID PANEL
Cholesterol: 229 mg/dL — ABNORMAL HIGH (ref 0–200)
HDL: 48.7 mg/dL (ref >39.00)
LDL Cholesterol: 151 mg/dL — ABNORMAL HIGH (ref 0–99)
NonHDL: 180.67
Total CHOL/HDL Ratio: 5
Triglycerides: 148 mg/dL (ref 0.0–149.0)
VLDL: 29.6 mg/dL (ref 0.0–40.0)

## 2022-11-12 LAB — PSA: PSA: 2.13 ng/mL (ref 0.10–4.00)

## 2022-11-19 ENCOUNTER — Ambulatory Visit: Payer: BC Managed Care – PPO | Admitting: Family Medicine

## 2022-11-19 ENCOUNTER — Encounter: Payer: Self-pay | Admitting: Family Medicine

## 2022-11-19 VITALS — BP 122/62 | HR 93 | Temp 98.3°F | Ht 71.0 in | Wt 168.0 lb

## 2022-11-19 DIAGNOSIS — Z0000 Encounter for general adult medical examination without abnormal findings: Secondary | ICD-10-CM | POA: Diagnosis not present

## 2022-11-19 DIAGNOSIS — N529 Male erectile dysfunction, unspecified: Secondary | ICD-10-CM

## 2022-11-19 DIAGNOSIS — Z7189 Other specified counseling: Secondary | ICD-10-CM

## 2022-11-19 DIAGNOSIS — E785 Hyperlipidemia, unspecified: Secondary | ICD-10-CM

## 2022-11-19 MED ORDER — SILDENAFIL CITRATE 100 MG PO TABS: 50.0000 mg | ORAL_TABLET | Freq: Every day | ORAL | 12 refills | Status: DC | PRN

## 2022-11-19 NOTE — Progress Notes (Unsigned)
CPE- See plan.  Routine anticipatory guidance given to patient.  See health maintenance.  The possibility exists that previously documented standard health maintenance information may have been brought forward from a previous encounter into this note.  If needed, that same information has been updated to reflect the current situation based on today's encounter.    Tetanus 2020 Flu shot to be done this fall.   covid vaccine  to be done this fall.   PNA not due.  Shingrix prev done.   Prostate cancer screening and PSA options (with potential risks and benefits of testing vs not testing) were discussed along with recent recs/guidelines.  He opted testing PSA at this point. PSA d/w pt.   Colonoscopy 2022.   Living will.  Discussed, encouraged.  Wife designated if patient were incapacitated.   HIV prev tested.  HCV prev neg.   Diet and exercise d/w pt.  diet handout given to patient.   Lipids d/w pt.  Discussed diet and exercise and consider statin if ASCVD risk isn't better.    ED improved with sildenafil.  No ADE on med. Not NTG use.  Rx printed for patient.    PMH and SH reviewed  Meds, vitals, and allergies reviewed.   ROS: Per HPI.  Unless specifically indicated otherwise in HPI, the patient denies:  General: fever. Eyes: acute vision changes ENT: sore throat Cardiovascular: chest pain Respiratory: SOB GI: vomiting GU: dysuria Musculoskeletal: acute back pain Derm: acute rash Neuro: acute motor dysfunction Psych: worsening mood Endocrine: polydipsia Heme: bleeding Allergy: hayfever  GEN: nad, alert and oriented HEENT: ncat NECK: supple w/o LA CV: rrr. PULM: ctab, no inc wob ABD: soft, +bs EXT: no edema SKIN: no acute rash  The 10-year ASCVD risk score (Arnett DK, et al., 2019) is: 11.4%   Values used to calculate the score:     Age: 62 years     Sex: Male     Is Non-Hispanic African American: No     Diabetic: No     Tobacco smoker: No     Systolic Blood  Pressure: 122 mmHg     Is BP treated: No     HDL Cholesterol: 48.7 mg/dL     Total Cholesterol: 229 mg/dL

## 2022-11-19 NOTE — Patient Instructions (Signed)
Keep working on diet and exercise.  Update me as needed.  Take care.  Glad to see you.

## 2022-11-21 NOTE — Assessment & Plan Note (Signed)
ED improved with sildenafil.  No ADE on med. Not NTG use.  Rx printed for patient.

## 2022-11-21 NOTE — Assessment & Plan Note (Signed)
Tetanus 2020 Flu shot to be done this fall.   covid vaccine  to be done this fall.   PNA not due.  Shingrix prev done.   Prostate cancer screening and PSA options (with potential risks and benefits of testing vs not testing) were discussed along with recent recs/guidelines.  He opted testing PSA at this point. PSA d/w pt.   Colonoscopy 2022.   Living will.  Discussed, encouraged.  Wife designated if patient were incapacitated.   HIV prev tested.  HCV prev neg.   Diet and exercise d/w pt.  diet handout given to patient.

## 2022-11-21 NOTE — Assessment & Plan Note (Signed)
Discussed diet and exercise and consider statin if ASCVD risk isn't better.  We can recheck periodically.

## 2022-11-21 NOTE — Assessment & Plan Note (Signed)
Living will.  Discussed, encouraged.  Wife designated if patient were incapacitated.

## 2023-11-03 ENCOUNTER — Other Ambulatory Visit: Payer: Self-pay | Admitting: Family Medicine

## 2023-11-03 DIAGNOSIS — E785 Hyperlipidemia, unspecified: Secondary | ICD-10-CM

## 2023-11-03 DIAGNOSIS — Z125 Encounter for screening for malignant neoplasm of prostate: Secondary | ICD-10-CM

## 2023-11-14 ENCOUNTER — Other Ambulatory Visit: Payer: BC Managed Care – PPO

## 2023-11-15 ENCOUNTER — Other Ambulatory Visit

## 2023-11-15 DIAGNOSIS — E785 Hyperlipidemia, unspecified: Secondary | ICD-10-CM

## 2023-11-15 DIAGNOSIS — Z125 Encounter for screening for malignant neoplasm of prostate: Secondary | ICD-10-CM | POA: Diagnosis not present

## 2023-11-15 LAB — COMPREHENSIVE METABOLIC PANEL WITH GFR
ALT: 18 U/L (ref 0–53)
AST: 21 U/L (ref 0–37)
Albumin: 4.5 g/dL (ref 3.5–5.2)
Alkaline Phosphatase: 61 U/L (ref 39–117)
BUN: 16 mg/dL (ref 6–23)
CO2: 26 meq/L (ref 19–32)
Calcium: 9.1 mg/dL (ref 8.4–10.5)
Chloride: 104 meq/L (ref 96–112)
Creatinine, Ser: 1 mg/dL (ref 0.40–1.50)
GFR: 79.8 mL/min (ref >60.00)
Glucose, Bld: 98 mg/dL (ref 70–99)
Potassium: 4.8 meq/L (ref 3.5–5.1)
Sodium: 137 meq/L (ref 135–145)
Total Bilirubin: 0.4 mg/dL (ref 0.2–1.2)
Total Protein: 6.9 g/dL (ref 6.0–8.3)

## 2023-11-15 LAB — LDL CHOLESTEROL, DIRECT: Direct LDL: 129 mg/dL

## 2023-11-15 LAB — LIPID PANEL
Cholesterol: 255 mg/dL — ABNORMAL HIGH (ref 0–200)
HDL: 37.4 mg/dL — ABNORMAL LOW (ref >39.00)
NonHDL: 218.05
Total CHOL/HDL Ratio: 7
Triglycerides: 461 mg/dL — ABNORMAL HIGH (ref 0.0–149.0)
VLDL: 92.2 mg/dL — ABNORMAL HIGH (ref 0.0–40.0)

## 2023-11-15 LAB — PSA: PSA: 2.27 ng/mL (ref 0.10–4.00)

## 2023-11-17 ENCOUNTER — Ambulatory Visit: Payer: Self-pay | Admitting: Family Medicine

## 2023-11-21 ENCOUNTER — Encounter: Payer: Self-pay | Admitting: Family Medicine

## 2023-11-21 ENCOUNTER — Ambulatory Visit (INDEPENDENT_AMBULATORY_CARE_PROVIDER_SITE_OTHER): Payer: BC Managed Care – PPO | Admitting: Family Medicine

## 2023-11-21 VITALS — BP 138/84 | HR 81 | Temp 98.1°F | Ht 72.24 in | Wt 179.8 lb

## 2023-11-21 DIAGNOSIS — Z0000 Encounter for general adult medical examination without abnormal findings: Secondary | ICD-10-CM | POA: Diagnosis not present

## 2023-11-21 DIAGNOSIS — N529 Male erectile dysfunction, unspecified: Secondary | ICD-10-CM

## 2023-11-21 DIAGNOSIS — Z7189 Other specified counseling: Secondary | ICD-10-CM

## 2023-11-21 DIAGNOSIS — E785 Hyperlipidemia, unspecified: Secondary | ICD-10-CM

## 2023-11-21 MED ORDER — SILDENAFIL CITRATE 100 MG PO TABS: 50.0000 mg | ORAL_TABLET | Freq: Every day | ORAL | 12 refills | Status: AC | PRN

## 2023-11-21 NOTE — Progress Notes (Signed)
 CPE- See plan.  Routine anticipatory guidance given to patient.  See health maintenance.  The possibility exists that previously documented standard health maintenance information may have been brought forward from a previous encounter into this note.  If needed, that same information has been updated to reflect the current situation based on today's encounter.    Tetanus 2020 Flu shot to be done this fall.   covid vaccine d/w pt. To be done later this fall.  PNA d/w pt Shingrix prev done.   Prostate cancer screening and PSA options (with potential risks and benefits of testing vs not testing) were discussed along with recent recs/guidelines.  He opted testing PSA at this point. PSA d/w pt.   Colonoscopy 2022.  letter given to patient 2025 re: follow up.   Living will.  Discussed, encouraged.  Wife designated if patient were incapacitated.   HIV prev tested.  HCV prev neg.   Diet and exercise d/w pt.    ED improved with sildenafil .  No ADE on med.  Rx printed for patient.   HLD d/w pt, esp TGs.  He was fasting on his labs.  He has h/o sig TG level variation over the years.    Prev chest wall pain is improving.    PMH and SH reviewed  Meds, vitals, and allergies reviewed.   ROS: Per HPI.  Unless specifically indicated otherwise in HPI, the patient denies:  General: fever. Eyes: acute vision changes ENT: sore throat Cardiovascular: chest pain Respiratory: SOB GI: vomiting GU: dysuria Musculoskeletal: acute back pain Derm: acute rash Neuro: acute motor dysfunction Psych: worsening mood Endocrine: polydipsia Heme: bleeding Allergy: hayfever  GEN: nad, alert and oriented HEENT: mucous membranes moist NECK: supple w/o LA CV: rrr. PULM: ctab, no inc wob ABD: soft, +bs EXT: no edema SKIN: no acute rash

## 2023-11-21 NOTE — Patient Instructions (Addendum)
 Please call GI about follow up.  Take care.  Glad to see you.  Recheck fasting labs in about 1-2 month and limit extra greasy foods/fats/etc prior.   Flu and covid shot when possible.

## 2023-11-24 NOTE — Assessment & Plan Note (Signed)
Living will.  Discussed, encouraged.  Wife designated if patient were incapacitated.

## 2023-11-24 NOTE — Assessment & Plan Note (Signed)
 Tetanus 2020 Flu shot to be done this fall.   covid vaccine d/w pt. To be done later this fall.  PNA d/w pt Shingrix prev done.   Prostate cancer screening and PSA options (with potential risks and benefits of testing vs not testing) were discussed along with recent recs/guidelines.  He opted testing PSA at this point. PSA d/w pt.   Colonoscopy 2022.  letter given to patient 2025 re: follow up.   Living will.  Discussed, encouraged.  Wife designated if patient were incapacitated.   HIV prev tested.  HCV prev neg.   Diet and exercise d/w pt.

## 2023-11-24 NOTE — Assessment & Plan Note (Signed)
 ED improved with sildenafil .  No ADE on med.  Rx printed for patient.   Continue as is.

## 2023-11-24 NOTE — Assessment & Plan Note (Signed)
 HLD d/w pt, esp TGs.  He was fasting on his labs.  He has h/o sig TG level variation over the years.   Discussed attention to diet. Recheck fasting labs in about 1-2 month and limit extra greasy foods/fats/etc prior.

## 2023-12-23 ENCOUNTER — Other Ambulatory Visit

## 2023-12-23 DIAGNOSIS — E785 Hyperlipidemia, unspecified: Secondary | ICD-10-CM

## 2023-12-23 LAB — LIPID PANEL
Cholesterol: 254 mg/dL — ABNORMAL HIGH (ref 0–200)
HDL: 33.7 mg/dL — ABNORMAL LOW (ref >39.00)
NonHDL: 220
Total CHOL/HDL Ratio: 8
Triglycerides: 726 mg/dL — ABNORMAL HIGH (ref 0.0–149.0)
VLDL: 145.2 mg/dL — ABNORMAL HIGH (ref 0.0–40.0)

## 2023-12-23 LAB — LDL CHOLESTEROL, DIRECT: Direct LDL: 129 mg/dL

## 2023-12-28 ENCOUNTER — Ambulatory Visit: Payer: Self-pay | Admitting: Family Medicine

## 2023-12-28 DIAGNOSIS — E785 Hyperlipidemia, unspecified: Secondary | ICD-10-CM

## 2023-12-28 MED ORDER — FENOFIBRATE 48 MG PO TABS: 48.0000 mg | ORAL_TABLET | Freq: Every day | ORAL | 3 refills | Status: AC

## 2023-12-30 ENCOUNTER — Telehealth: Payer: Self-pay

## 2023-12-30 NOTE — Telephone Encounter (Signed)
 Please contact patient regarding advice from PCP on medication.  Copied from CRM [id number]. Topic: Clinical - Medication Question >> Dec 30, 2023  8:49 AM Cody Ryan wrote: Reason for CRM: Pt was prescribed fenofibrate (TRICOR) 48 MG tablet and wants to know his steps for taking this medication. Pt did have labs on 10/27 and his triglyceride was high. Pt is unsure of his next steps with this and would like a callback.

## 2023-12-30 NOTE — Telephone Encounter (Signed)
 Spoke to pt. He said he did not see Dr Elfredia message on the lab notes. Made him a lab visit for 04-01-24. Advised him to let us  know if he has issues with the medication.

## 2024-01-10 ENCOUNTER — Ambulatory Visit

## 2024-01-10 VITALS — Ht 72.0 in | Wt 175.0 lb

## 2024-01-10 DIAGNOSIS — Z860100 Personal history of colon polyps, unspecified: Secondary | ICD-10-CM

## 2024-01-10 MED ORDER — NA SULFATE-K SULFATE-MG SULF 17.5-3.13-1.6 GM/177ML PO SOLN: 1.0000 | Freq: Once | ORAL | 0 refills | Status: AC

## 2024-01-10 NOTE — Progress Notes (Signed)
 PCP MD at time of PV: Arlyss Solian , MD  __________________________________________________________________________________________________________________________________________  No egg allergy known to patient  No soy allergy known to patient No issues known to pt with past sedation with any surgeries or procedures Patient denies ever being told they had issues or difficulty with intubation  No FH of Malignant Hyperthermia Pt is not on diet pills Pt is not on  home 02  Pt is not on blood thinners  No A fib or A flutter Have any cardiac testing pending--no LOA: independent  No Chew or Snuff tobacco __________________________________________________________________________________________________________________________________________  Constipation: no  Prep: suprep  __________________________________________________________________________________________________________________________________________  PV completed with patient. Prep instructions reviewed and provided during apt. Rx sent to preferred pharmacy.  __________________________________________________________________________________________________________________________________________  Patient's chart reviewed by Norleen Schillings CNRA prior to previsit and patient appropriate for the [facility].  Previsit completed and red dot placed by patient's name on their procedure day (on provider's schedule).

## 2024-01-16 ENCOUNTER — Encounter: Payer: Self-pay | Admitting: Gastroenterology

## 2024-01-20 ENCOUNTER — Telehealth: Payer: Self-pay | Admitting: Gastroenterology

## 2024-01-20 NOTE — Telephone Encounter (Signed)
 Got it, thanks for letting me know.  Sorry to hear this

## 2024-01-20 NOTE — Telephone Encounter (Signed)
 Goodmorning Dr.Armbruster,  Patient calling needing to reschedule procedure from 01/22/24 due to mother being admitted into the hospital. Patient has been rescheduled for 03/13/24. Please advise  Thank you

## 2024-01-22 ENCOUNTER — Encounter: Admitting: Gastroenterology

## 2024-03-13 ENCOUNTER — Encounter: Payer: Self-pay | Admitting: Gastroenterology

## 2024-03-13 ENCOUNTER — Ambulatory Visit: Admitting: Gastroenterology

## 2024-03-13 VITALS — BP 121/87 | HR 76 | Temp 97.9°F | Resp 13 | Ht 72.0 in | Wt 175.0 lb

## 2024-03-13 DIAGNOSIS — Z860100 Personal history of colon polyps, unspecified: Secondary | ICD-10-CM

## 2024-03-13 DIAGNOSIS — D125 Benign neoplasm of sigmoid colon: Secondary | ICD-10-CM

## 2024-03-13 DIAGNOSIS — K648 Other hemorrhoids: Secondary | ICD-10-CM | POA: Diagnosis not present

## 2024-03-13 DIAGNOSIS — Z1211 Encounter for screening for malignant neoplasm of colon: Secondary | ICD-10-CM

## 2024-03-13 DIAGNOSIS — K635 Polyp of colon: Secondary | ICD-10-CM

## 2024-03-13 DIAGNOSIS — Z860101 Personal history of adenomatous and serrated colon polyps: Secondary | ICD-10-CM | POA: Diagnosis not present

## 2024-03-13 DIAGNOSIS — D120 Benign neoplasm of cecum: Secondary | ICD-10-CM

## 2024-03-13 DIAGNOSIS — D123 Benign neoplasm of transverse colon: Secondary | ICD-10-CM

## 2024-03-13 MED ORDER — SODIUM CHLORIDE 0.9 % IV SOLN: 500.0000 mL | Freq: Once | INTRAVENOUS | Status: DC

## 2024-03-13 NOTE — Op Note (Signed)
 [facility] Patient Name: Cody Ryan Procedure Date: 03/13/2024 8:38 AM MRN: [id number] Endoscopist: Elspeth P. Leigh , MD, [id number] Age: 64 Referring MD:  Date of Birth: 01-15-1961 Gender: Male Account #: [id number] Procedure:                Colonoscopy Indications:              High risk colon cancer surveillance: Personal                            history of colonic polyps - 4 adenomas removed                            11/2020 - Dr. Eda Medicines:                Monitored Anesthesia Care Procedure:                Pre-Anesthesia Assessment:                           - Prior to the procedure, a History and Physical                            was performed, and patient medications and                            allergies were reviewed. The patient's tolerance of                            previous anesthesia was also reviewed. The risks                            and benefits of the procedure and the sedation                            options and risks were discussed with the patient.                            All questions were answered, and informed consent                            was obtained. Prior Anticoagulants: The patient has                            taken no anticoagulant or antiplatelet agents. ASA                            Grade Assessment: I - A normal, healthy patient.                            After reviewing the risks and benefits, the patient                            was deemed in satisfactory condition to undergo the  procedure.                           After obtaining informed consent, the colonoscope                            was passed under direct vision. Throughout the                            procedure, the patient's blood pressure, pulse, and                            oxygen saturations were monitored continuously. The                            CF [id number] #[id number] was introduced through the anus                             and advanced to the the cecum, identified by                            appendiceal orifice and ileocecal valve. The                            colonoscopy was performed without difficulty. The                            patient tolerated the procedure well. The quality                            of the bowel preparation was good. The ileocecal                            valve, appendiceal orifice, and rectum were                            photographed. Scope In: 8:44:25 AM Scope Out: 9:03:05 AM Scope Withdrawal Time: 0 hours 13 minutes 53 seconds  Total Procedure Duration: 0 hours 18 minutes 40 seconds  Findings:                 The perianal and digital rectal examinations were                            normal.                           A 3 mm polyp was found in the cecum. The polyp was                            flat. The polyp was removed with a cold snare.                            Resection and retrieval were complete.  A non-bleeding superficial mucosal laceration as a                            result of scope trauma was found in the ascending                            colon durinf withdrawal of the scope. There was an                            angulated turn entering the right colon, did not                            appreciate any trauma during cecal intubation. This                            was lavaged, appeared very superficial, however out                            of an abundance of caution, to repair the defect,                            the tissue edges were approximated and two                            hemostatic clips were successfully placed, which                            worked well.                           A 3 mm polyp was found in the hepatic flexure. The                            polyp was sessile. The polyp was removed with a                            cold snare. Resection and retrieval were  complete.                           A diminutive polyp was found in the transverse                            colon. The polyp was sessile. The polyp was removed                            with a cold snare. Resection and retrieval were                            complete.                           A 3 mm polyp was found in the sigmoid colon. The  polyp was sessile. The polyp was removed with a                            cold snare. Resection and retrieval were complete.                           Internal hemorrhoids were found during retroflexion.                           The exam was otherwise without abnormality. Complications:            No immediate complications. Estimated blood loss:                            Minimal. Estimated Blood Loss:     Estimated blood loss was minimal. Impression:               - One 3 mm polyp in the cecum, removed with a cold                            snare. Resected and retrieved.                           - Incidental superficial mucosal trauma /                            laceration in the ascending colon. Clips were                            placed x 2 with good result.                           - One 3 mm polyp at the hepatic flexure, removed                            with a cold snare. Resected and retrieved.                           - One diminutive polyp in the transverse colon,                            removed with a cold snare. Resected and retrieved.                           - One 3 mm polyp in the sigmoid colon, removed with                            a cold snare. Resected and retrieved.                           - Internal hemorrhoids.                           - The examination was otherwise normal. Recommendation:           -  Patient has a contact number available for                            emergencies. The signs and symptoms of potential                            delayed complications were discussed  with the                            patient. Return to normal activities tomorrow.                            Written discharge instructions were provided to the                            patient.                           - Resume previous diet.                           - Continue present medications.                           - Await pathology results. Elspeth P. Deshanna Kama, MD 03/13/2024 9:12:51 AM This report has been signed electronically.

## 2024-03-13 NOTE — Patient Instructions (Addendum)
 Resume previous diet  Continue present medications  Await pathology results  See handouts on polyps  Carry [employer] clip card with you for 30 days in case of an MRI YOU HAD AN ENDOSCOPIC PROCEDURE TODAY AT THE [facility]:   Refer to the procedure report that was given to you for any specific questions about what was found during the examination.  If the procedure report does not answer your questions, please call your gastroenterologist to clarify.  If you requested that your care partner not be given the details of your procedure findings, then the procedure report has been included in a sealed envelope for you to review at your convenience later.  YOU SHOULD EXPECT: Some feelings of bloating in the abdomen. Passage of more gas than usual.  Walking can help get rid of the air that was put into your GI tract during the procedure and reduce the bloating. If you had a lower endoscopy (such as a colonoscopy or flexible sigmoidoscopy) you may notice spotting of blood in your stool or on the toilet paper. If you underwent a bowel prep for your procedure, you may not have a normal bowel movement for a few days.  Please Note:  You might notice some irritation and congestion in your nose or some drainage.  This is from the oxygen used during your procedure.  There is no need for concern and it should clear up in a day or so.  SYMPTOMS TO REPORT IMMEDIATELY: Following lower endoscopy (colonoscopy or flexible sigmoidoscopy):  Excessive amounts of blood in the stool  Significant tenderness or worsening of abdominal pains  Swelling of the abdomen that is new, acute  Fever of 100F or higher  For urgent or emergent issues, a gastroenterologist can be reached at any hour by calling [phone]. Do not use MyChart messaging for urgent concerns.   DIET:  We do recommend a small meal at first, but then you may proceed to your regular diet.  Drink plenty of fluids but you should avoid alcoholic  beverages for 24 hours.  ACTIVITY:  You should plan to take it easy for the rest of today and you should NOT DRIVE or use heavy machinery until tomorrow (because of the sedation medicines used during the test).    FOLLOW UP: Our staff will call the number listed on your records the next business day following your procedure.  We will call around 7:15- 8:00 am to check on you and address any questions or concerns that you may have regarding the information given to you following your procedure. If we do not reach you, we will leave a message.     If any biopsies were taken you will be contacted by phone or by letter within the next 1-3 weeks.  Please call us  at [phone] if you have not heard about the biopsies in 3 weeks.   SIGNATURES/CONFIDENTIALITY: You and/or your care partner have signed paperwork which will be entered into your electronic medical record.  These signatures attest to the fact that that the information above on your After Visit Summary has been reviewed and is understood.  Full responsibility of the confidentiality of this discharge information lies with you and/or your care-partner.

## 2024-03-13 NOTE — Progress Notes (Signed)
 Pt's states no medical or surgical changes since previsit or office visit.

## 2024-03-13 NOTE — Progress Notes (Signed)
 [facility] History and Physical   Primary Care Physician:  Cleatus Arlyss RAMAN, MD   Reason for Procedure:   History of colon polyps  Plan:    colonoscopy     HPI: Cody Ryan is a 64 y.o. male  here for colonoscopy surveillance - 4 adenomas removed 11/2020 - Dr. Eda.   Patient denies any bowel symptoms at this time. No family history of colon cancer known. Otherwise feels well without any cardiopulmonary symptoms.   I have discussed risks / benefits of anesthesia and endoscopic procedure with [name] and they wish to proceed with the exams as outlined today.   The patient was provided an opportunity to ask questions and all were answered. The patient agreed with the plan.    Past Medical History:  Diagnosis Date   Erectile dysfunction    HLD (hyperlipidemia)     Past Surgical History:  Procedure Laterality Date   TENDON REPAIR     Right hand, 3rd finger    Prior to Admission medications  Medication Sig Start Date End Date Taking? Authorizing Provider  cholecalciferol (VITAMIN D) 1000 units tablet Take 1,000 Units by mouth daily.   Yes [provider]  fenofibrate  (TRICOR ) 48 MG tablet Take 1 tablet (48 mg total) by mouth daily. 12/28/23  Yes Cleatus Arlyss RAMAN, MD  Multiple Vitamin (MULTIVITAMIN) tablet Take 1 tablet by mouth daily.   Yes [provider]  sildenafil  (VIAGRA ) 100 MG tablet Take 0.5-1 tablets (50-100 mg total) by mouth daily as needed for erectile dysfunction. 11/21/23 05/21/24  Cleatus Arlyss RAMAN, MD    Current Outpatient Medications  Medication Sig Dispense Refill   cholecalciferol (VITAMIN D) 1000 units tablet Take 1,000 Units by mouth daily.     fenofibrate  (TRICOR ) 48 MG tablet Take 1 tablet (48 mg total) by mouth daily. 90 tablet 3   Multiple Vitamin (MULTIVITAMIN) tablet Take 1 tablet by mouth daily.     sildenafil  (VIAGRA ) 100 MG tablet Take 0.5-1 tablets (50-100 mg total) by mouth daily as needed for erectile  dysfunction. 10 tablet 12   Current Facility-Administered Medications  Medication Dose Route Frequency Provider Last Rate Last Admin   0.9 %  sodium chloride  infusion  500 mL Intravenous Once Halei Hanover, Elspeth SQUIBB, MD        Allergies as of 03/13/2024   (No Known Allergies)    Family History  Problem Relation Age of Onset   Cancer Mother        Breast, treated   Skin cancer Mother    Colon cancer Neg Hx    Prostate cancer Neg Hx    Colon polyps Neg Hx    Esophageal cancer Neg Hx    Rectal cancer Neg Hx     Social History   Socioeconomic History   Marital status: Married    Spouse name: Not on file   Number of children: Not on file   Years of education: Not on file   Highest education level: Not on file  Occupational History   Not on file  Tobacco Use   Smoking status: Never    Passive exposure: Yes   Smokeless tobacco: Never  Vaping Use   Vaping status: Never Used  Substance and Sexual Activity   Alcohol use: Yes    Alcohol/week: 2.0 standard drinks of alcohol    Types: 2 Standard drinks or equivalent per week    Comment: 2 beers a week or less   Drug use: No   Sexual  activity: Not on file  Other Topics Concern   Not on file  Social History Narrative   Born in [place] , 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      Working in product manager, remote work.     Social Drivers of Health   Tobacco Use: Medium Risk (03/13/2024)   Patient History    Smoking Tobacco Use: Never    Smokeless Tobacco Use: Never    Passive Exposure: Yes  Financial Resource Strain: Not on file  Food Insecurity: Not on file  Transportation Needs: Not on file  Physical Activity: Not on file  Stress: Not on file  Social Connections: Unknown (07/11/2021)   Received from [facility]   Social Network    Social Network: Not on file  Intimate Partner  Violence: Unknown (06/02/2021)   Received from [facility]   HITS    Physically Hurt: Not on file    Insult or Talk Down To: Not on file    Threaten Physical Harm: Not on file    Scream or Curse: Not on file  Depression (PHQ2-9): Low Risk (11/21/2023)   Depression (PHQ2-9)    PHQ-2 Score: 0  Alcohol Screen: Not on file  Housing: Not on file  Utilities: Not on file  Health Literacy: Not on file    Review of Systems: All other review of systems negative except as mentioned in the HPI.  Physical Exam: Vital signs BP (!) 140/80   Pulse 86   Temp 97.9 F (36.6 C) (Skin)   Ht 6' (1.829 m)   Wt 175 lb (79.4 kg)   SpO2 96%   BMI 23.73 kg/m   General:   Alert,  Well-developed, pleasant and cooperative in NAD Lungs:  Clear throughout to auscultation.   Heart:  Regular rate and rhythm Abdomen:  Soft, nontender and nondistended.   Neuro/Psych:  Alert and cooperative. Normal mood and affect. A and O x 3  Marcey Naval, MD [facility]

## 2024-03-13 NOTE — Progress Notes (Signed)
 Called to room to assist during endoscopic procedure.  Patient ID and intended procedure confirmed with present staff. Received instructions for my participation in the procedure from the performing physician.

## 2024-03-13 NOTE — Progress Notes (Signed)
 Sedate, gd SR, tolerated procedure well, VSS, report to RN

## 2024-03-16 ENCOUNTER — Telehealth: Payer: Self-pay

## 2024-03-16 NOTE — Telephone Encounter (Signed)
" °  Follow up Call-     03/13/2024    8:00 AM  Call back number  Post procedure Call Back phone  # [phone]  Permission to leave phone message Yes     Patient questions:  Do you have a fever, pain , or abdominal swelling? No. Pain Score  0 *  Have you tolerated food without any problems? Yes.    Have you been able to return to your normal activities? Yes.    Do you have any questions about your discharge instructions: Diet   No. Medications  No. Follow up visit  No.  Do you have questions or concerns about your Care? No.  Actions: * If pain score is 4 or above: No action needed, pain <4.   "

## 2024-03-17 ENCOUNTER — Ambulatory Visit: Payer: Self-pay | Admitting: Gastroenterology

## 2024-03-17 LAB — SURGICAL PATHOLOGY

## 2024-04-01 ENCOUNTER — Other Ambulatory Visit

## 2024-04-01 DIAGNOSIS — E785 Hyperlipidemia, unspecified: Secondary | ICD-10-CM

## 2024-04-01 LAB — LIPID PANEL
Cholesterol: 183 mg/dL (ref 28–200)
HDL: 43.9 mg/dL
LDL Cholesterol: 109 mg/dL — ABNORMAL HIGH (ref 10–99)
NonHDL: 138.72
Total CHOL/HDL Ratio: 4
Triglycerides: 150 mg/dL — ABNORMAL HIGH (ref 10.0–149.0)
VLDL: 30 mg/dL (ref 0.0–40.0)

## 2024-11-23 ENCOUNTER — Ambulatory Visit: Admitting: Family Medicine
